# Patient Record
Sex: Male | Born: 1985
Health system: Southern US, Community
[De-identification: ages and names within clinical notes are randomized; demographics above are authoritative.]

## PROBLEM LIST (undated history)

## (undated) DIAGNOSIS — I10 Essential (primary) hypertension: Secondary | ICD-10-CM

## (undated) HISTORY — DX: Essential (primary) hypertension: I10

---

## 2016-01-14 ENCOUNTER — Telehealth: Payer: Self-pay | Admitting: Behavioral Health

## 2016-01-14 ENCOUNTER — Encounter: Payer: Self-pay | Admitting: Behavioral Health

## 2016-01-14 NOTE — Telephone Encounter (Signed)
Pre-Visit Call completed with patient and chart updated.   Pre-Visit Info documented in Specialty Comments under Snap[employer].    

## 2016-01-15 ENCOUNTER — Telehealth: Payer: Self-pay | Admitting: Medical

## 2016-01-15 ENCOUNTER — Encounter: Payer: Self-pay | Admitting: Medical

## 2016-01-15 ENCOUNTER — Ambulatory Visit (INDEPENDENT_AMBULATORY_CARE_PROVIDER_SITE_OTHER): Payer: 59 | Admitting: Medical

## 2016-01-15 VITALS — BP 128/88 | HR 77 | Temp 98.6°F | Ht 77.0 in | Wt 384.4 lb

## 2016-01-15 DIAGNOSIS — Z113 Encounter for screening for infections with a predominantly sexual mode of transmission: Secondary | ICD-10-CM

## 2016-01-15 DIAGNOSIS — I10 Essential (primary) hypertension: Secondary | ICD-10-CM | POA: Insufficient documentation

## 2016-01-15 DIAGNOSIS — R7989 Other specified abnormal findings of blood chemistry: Secondary | ICD-10-CM | POA: Diagnosis not present

## 2016-01-15 LAB — COMPREHENSIVE METABOLIC PANEL
ALT: 56 U/L — AB (ref 0–53)
AST: 27 U/L (ref 0–37)
Albumin: 4.3 g/dL (ref 3.5–5.2)
Alkaline Phosphatase: 67 U/L (ref 39–117)
BILIRUBIN TOTAL: 0.8 mg/dL (ref 0.2–1.2)
BUN: 16 mg/dL (ref 6–23)
CHLORIDE: 106 meq/L (ref 96–112)
CO2: 27 meq/L (ref 19–32)
CREATININE: 1.03 mg/dL (ref 0.40–1.50)
Calcium: 9.4 mg/dL (ref 8.4–10.5)
GFR: 91.21 mL/min (ref >60.00)
GLUCOSE: 80 mg/dL (ref 70–99)
Potassium: 4.1 mEq/L (ref 3.5–5.1)
SODIUM: 140 meq/L (ref 135–145)
Total Protein: 7.4 g/dL (ref 6.0–8.3)

## 2016-01-15 LAB — LIPID PANEL
CHOL/HDL RATIO: 5
Cholesterol: 143 mg/dL (ref 0–200)
HDL: 31.2 mg/dL — ABNORMAL LOW (ref >39.00)
LDL CALC: 91 mg/dL (ref 0–99)
NONHDL: 111.77
TRIGLYCERIDES: 103 mg/dL (ref 0.0–149.0)
VLDL: 20.6 mg/dL (ref 0.0–40.0)

## 2016-01-15 LAB — VITAMIN D 25 HYDROXY (VIT D DEFICIENCY, FRACTURES): VITD: 26.61 ng/mL — ABNORMAL LOW (ref 30.00–100.00)

## 2016-01-15 MED ORDER — OLMESARTAN MEDOXOMIL-HCTZ 40-12.5 MG PO TABS: 1.0000 | ORAL_TABLET | Freq: Every day | ORAL | Status: DC

## 2016-01-15 MED ORDER — VITAMIN D (ERGOCALCIFEROL) 1.25 MG (50000 UNIT) PO CAPS: 50000.0000 [IU] | ORAL_CAPSULE | ORAL | Status: DC

## 2016-01-15 NOTE — Assessment & Plan Note (Signed)
Refill benicar hct.

## 2016-01-15 NOTE — Patient Instructions (Addendum)
For you blood pressure would continue same benicar/hct.  Will recheck vitamin D level today as well.  Will check cmp and lipid panel today.  Recommend diet and exercise to maintain or loose weight.  Follow up in 3 months or as needed. Can do complete physical exam at that time if you want.

## 2016-01-15 NOTE — Assessment & Plan Note (Signed)
Diet and exercise counseled briefly.

## 2016-01-15 NOTE — Telephone Encounter (Signed)
Sent in ergocalciferol. While on rx high dose. Stop his daily low dose vitamin d.

## 2016-01-15 NOTE — Progress Notes (Signed)
Pre visit review using our clinic review tool, if applicable. No additional management support is needed unless otherwise documented below in the visit note. 

## 2016-01-15 NOTE — Progress Notes (Signed)
Subjective:    Patient ID: David Zuniga, male    DOB: 21-Sep-1986, 30 y.o.   MRN: [id number]  HPI   I have reviewed pt PMH, PSH, FH, Social History and Surgical History.  Pt works Architectural technologist. New to area. Went to school in [place], [place] does not exercise regularly, pt drinks one energy drink a day, Pt moderate healthy, single.  Htn-  Pt is on benicar 40 mg a day. Pt has been on med for 2.5 years. Pt last labs done in about one year.  Pt has history of low vitamin D. On  vit d. 1 yr since last checked.    Review of Systems  Constitutional: Negative for fever, chills and fatigue.  Respiratory: Negative for choking, shortness of breath and wheezing.   Cardiovascular: Negative for chest pain and palpitations.  Gastrointestinal: Negative for abdominal pain.  Musculoskeletal: Negative for back pain, joint swelling and neck stiffness.       Occasional shoulder and knee pain. Had pain since teenager. Mild pain comes and goes. Sometimes month with no pain. Very rare and can't specify which area hurts worse.  In past one of shoulder got mri. Maybe mild rotator cuff tear determined no surgery needed  Skin: Negative for rash.  Neurological: Negative for facial asymmetry, weakness and light-headedness.  Hematological: Negative for adenopathy. Does not bruise/bleed easily.  Psychiatric/Behavioral: Negative for behavioral problems and confusion.    Past Medical History  Diagnosis Date  . Hypertension     Social History   Social History  . Marital Status: Single    Spouse Name: N/A  . Number of Children: N/A  . Years of Education: N/A   Occupational History  . Not on file.   Social History Main Topics  . Smoking status: Never Smoker   . Smokeless tobacco: Not on file  . Alcohol Use: Not on file  . Drug Use: Not on file  . Sexual Activity: Not on file   Other Topics Concern  . Not on file   Social History Narrative    History reviewed. No pertinent past surgical  history.  Family History  Problem Relation Age of Onset  . Hypertension Mother   . Bipolar disorder Mother     manic depressive  . Hypertension Father   . Cancer Maternal Grandfather 30    lung cancer  . Alcohol abuse Paternal Grandfather   . Heart disease Paternal Grandfather   . Osteoporosis Maternal Grandmother     No Known Allergies  Current Outpatient Prescriptions on File Prior to Visit  Medication Sig Dispense Refill  . cholecalciferol (VITAMIN D) 1000 units tablet Take 1,000 Units by mouth daily.     No current facility-administered medications on file prior to visit.    BP 129/91 mmHg  Pulse 77  Temp(Src) 98.6 F (37 C) (Oral)  Ht 6\' 5"  (1.956 m)  Wt 384 lb 6.4 oz (174.363 kg)  BMI 45.57 kg/m2  SpO2 96%      Objective:   Physical Exam  General Mental Status- Alert. General Appearance- Not in acute distress.   Skin General: Color- Normal Color. Moisture- Normal Moisture.  Neck Carotid Arteries- Normal color. Moisture- Normal Moisture. No carotid bruits. No JVD.  Chest and Lung Exam Auscultation: Breath Sounds:-Normal.  Cardiovascular Auscultation:Rythm- Regular. Murmurs & Other Heart Sounds:Auscultation of the heart reveals- No Murmurs.  Abdomen Inspection:-Inspection Normal. Palpation/Percussion:Note:No mass. Palpation and Percussion of the abdomen reveal- Non Tender, Non Distended + BS, no rebound or guarding.  Neurologic Cranial Nerve exam:- CN III-XII intact(No nystagmus), symmetric smile. Strength:- 5/5 equal and symmetric strength both upper and lower extremities.      Assessment & Plan:  For you blood pressure would continue same benicar/hct.  Will recheck vitamin D level today as well.  Will check cmp and lipid panel today.  Recommend diet and exercise to maintain or loose weight.  Follow up in 3 months or as needed. Can do complete physical exam at that time if you want.

## 2016-01-16 LAB — HIV ANTIBODY (ROUTINE TESTING W REFLEX): HIV 1&2 Ab, 4th Generation: NONREACTIVE

## 2016-01-18 NOTE — Telephone Encounter (Signed)
Left message for pt that Rx was at pharmacy.

## 2016-05-13 ENCOUNTER — Ambulatory Visit (INDEPENDENT_AMBULATORY_CARE_PROVIDER_SITE_OTHER): Payer: 59 | Admitting: Medical

## 2016-05-13 ENCOUNTER — Encounter: Payer: Self-pay | Admitting: Medical

## 2016-05-13 VITALS — BP 139/89 | HR 93 | Temp 98.7°F | Ht 77.0 in | Wt 395.4 lb

## 2016-05-13 DIAGNOSIS — I10 Essential (primary) hypertension: Secondary | ICD-10-CM | POA: Diagnosis not present

## 2016-05-13 DIAGNOSIS — R7989 Other specified abnormal findings of blood chemistry: Secondary | ICD-10-CM | POA: Diagnosis not present

## 2016-05-13 DIAGNOSIS — R748 Abnormal levels of other serum enzymes: Secondary | ICD-10-CM | POA: Diagnosis not present

## 2016-05-13 DIAGNOSIS — R002 Palpitations: Secondary | ICD-10-CM | POA: Diagnosis not present

## 2016-05-13 LAB — VITAMIN D 25 HYDROXY (VIT D DEFICIENCY, FRACTURES): VITD: 27.54 ng/mL — ABNORMAL LOW (ref 30.00–100.00)

## 2016-05-13 MED ORDER — OLMESARTAN MEDOXOMIL-HCTZ 40-12.5 MG PO TABS: 1.0000 | ORAL_TABLET | Freq: Every day | ORAL | Status: DC

## 2016-05-13 NOTE — Progress Notes (Addendum)
   Subjective:    Patient ID: David Zuniga, male    DOB: February 25, 1986, 30 y.o.   MRN: [id number]  HPI  Pt in for follow up on htn. Pt has not been checking his bp on regular basis. [place] 123456. Could not remember diastolic. He thinks was in 80's.   Pt is trying to exercise twice a week. He walks mostly. He has gained some weight. Pt describes trying to eat healthy. He eats out couple of times a week.  Pt had low vitamn D on last visit. Brief rx followed by otc vit d.  Pt good cholesterol was little low last time.  Pt also states sometimes he will go to asleep at night and will feel brief mild transient sensation of palpitation or "pvc". Pt had this in the past and he states 2 years ago he had a holter done. But he moved before he got the results. He is not sure of cardiologist office. Pt drinks 1-2 cups coffee a day.  Review of Systems  Constitutional: Negative for fever, chills and fatigue.  Respiratory: Negative for cough, chest tightness, shortness of breath and wheezing.   Cardiovascular: Negative for chest pain and palpitations.  Gastrointestinal: Negative for abdominal pain.  Musculoskeletal: Negative for back pain.  Skin: Negative for rash.  Neurological: Negative for dizziness and headaches.  Hematological: Negative for adenopathy. Does not bruise/bleed easily.  Psychiatric/Behavioral: Negative for behavioral problems and confusion.       Objective:   Physical Exam   General Mental Status- Alert. General Appearance- Not in acute distress.   Chest and Lung Exam Auscultation: Breath Sounds:-Normal.  Cardiovascular Auscultation:Rythm- Regular. Murmurs & Other Heart Sounds:Auscultation of the heart reveals- No Murmurs.  Abdomen Inspection:-Inspeection Normal. Palpation/Percussion:Note:No mass. Palpation and Percussion of the abdomen reveal- Non Tender, Non Distended + BS, no rebound or guarding.   Neurologic Cranial Nerve exam:- CN  III-XII intact(No nystagmus), symmetric smile.      Assessment & Plan:   ekg showed nsr toay   Your bp is borderline today but less than 140/90. May be related to being here. Continue you benicar.hctz. Check your bp 2-3 times a week. See if staying consistently less than 140/90.  For faint palpitation reduce caffeine intake. We did ekg today. Please sign release form so we can get holter monitor you had done 2 years ago.  Will repeat your vitamin d level today.  For low hdl increase exercise. Will repeat lipid panel/hdl if you get in consistent exercise pattern.  For your weight I would recommend trying to eat 2000-2200 calories. Since you report much higher intake you may see rapid weight loss with this reduction.  Follow up in 4 months or as needed  Anavictoria Wilk, Percell Miller, [employer]

## 2016-05-13 NOTE — Patient Instructions (Addendum)
Your bp is borderline today but less than 140/90. May be related to being here. Continue you benicar.hctz. Check your bp 2-3 times a week. See if staying consistently less than 140/90.  For faint palpitation reduce caffeine intake. We did ekg today. Please sign release form so we can get holter monitor you had done 2 years ago.  Will repeat your vitamin d level today.  For low hdl increase exercise. Will repeat lipid panel/hdl if you get in consistent exercise pattern.  For your weight I would recommend trying to eat 2000-2200 calories. Since you report much higher intake you may see rapid weight loss with this reduction.  Follow up in 4 months  or as needed

## 2016-05-14 ENCOUNTER — Telehealth: Payer: Self-pay | Admitting: Medical

## 2016-05-14 MED ORDER — VITAMIN D (ERGOCALCIFEROL) 1.25 MG (50000 UNIT) PO CAPS: 50000.0000 [IU] | ORAL_CAPSULE | ORAL | Status: DC

## 2016-05-14 NOTE — Telephone Encounter (Signed)
Refill ergocalciferol

## 2016-05-17 NOTE — Progress Notes (Signed)
Quick Note:  Pt has seen results on MyChart and message also sent for patient to call back if any questions. ______ 

## 2016-06-17 ENCOUNTER — Telehealth: Payer: Self-pay | Admitting: Medical

## 2016-06-17 NOTE — Telephone Encounter (Signed)
Faxed medical release form to [facility]

## 2016-06-26 ENCOUNTER — Other Ambulatory Visit: Payer: Self-pay | Admitting: Medical

## 2016-09-09 ENCOUNTER — Ambulatory Visit (INDEPENDENT_AMBULATORY_CARE_PROVIDER_SITE_OTHER): Payer: 59 | Admitting: Medical

## 2016-09-09 ENCOUNTER — Encounter: Payer: Self-pay | Admitting: Medical

## 2016-09-09 VITALS — BP 139/89 | HR 98 | Temp 98.7°F | Ht 77.0 in | Wt 393.2 lb

## 2016-09-09 DIAGNOSIS — K219 Gastro-esophageal reflux disease without esophagitis: Secondary | ICD-10-CM

## 2016-09-09 DIAGNOSIS — I10 Essential (primary) hypertension: Secondary | ICD-10-CM

## 2016-09-09 DIAGNOSIS — R112 Nausea with vomiting, unspecified: Secondary | ICD-10-CM | POA: Diagnosis not present

## 2016-09-09 DIAGNOSIS — R1110 Vomiting, unspecified: Secondary | ICD-10-CM | POA: Diagnosis not present

## 2016-09-09 DIAGNOSIS — R0982 Postnasal drip: Secondary | ICD-10-CM

## 2016-09-09 LAB — COMPREHENSIVE METABOLIC PANEL
ALT: 59 U/L — AB (ref 0–53)
AST: 26 U/L (ref 0–37)
Albumin: 4.3 g/dL (ref 3.5–5.2)
Alkaline Phosphatase: 71 U/L (ref 39–117)
BUN: 13 mg/dL (ref 6–23)
CO2: 28 meq/L (ref 19–32)
Calcium: 9.4 mg/dL (ref 8.4–10.5)
Chloride: 105 mEq/L (ref 96–112)
Creatinine, Ser: 1 mg/dL (ref 0.40–1.50)
GFR: 93.94 mL/min (ref >60.00)
GLUCOSE: 103 mg/dL — AB (ref 70–99)
POTASSIUM: 4 meq/L (ref 3.5–5.1)
Sodium: 140 mEq/L (ref 135–145)
Total Bilirubin: 0.7 mg/dL (ref 0.2–1.2)
Total Protein: 7.3 g/dL (ref 6.0–8.3)

## 2016-09-09 LAB — CBC WITH DIFFERENTIAL/PLATELET
BASOS ABS: 0 10*3/uL (ref 0.0–0.1)
Basophils Relative: 0.2 % (ref 0.0–3.0)
Eosinophils Absolute: 0.1 10*3/uL (ref 0.0–0.7)
Eosinophils Relative: 1.2 % (ref 0.0–5.0)
HCT: 42.1 % (ref 39.0–52.0)
Hemoglobin: 14.6 g/dL (ref 13.0–17.0)
LYMPHS ABS: 3 10*3/uL (ref 0.7–4.0)
Lymphocytes Relative: 29.1 % (ref 12.0–46.0)
MCHC: 34.6 g/dL (ref 30.0–36.0)
MCV: 87.5 fl (ref 78.0–100.0)
MONOS PCT: 8 % (ref 3.0–12.0)
Monocytes Absolute: 0.8 10*3/uL (ref 0.1–1.0)
NEUTROS ABS: 6.3 10*3/uL (ref 1.4–7.7)
NEUTROS PCT: 61.5 % (ref 43.0–77.0)
PLATELETS: 350 10*3/uL (ref 150.0–400.0)
RBC: 4.82 Mil/uL (ref 4.22–5.81)
RDW: 12.9 % (ref 11.5–15.5)
WBC: 10.3 10*3/uL (ref 4.0–10.5)

## 2016-09-09 MED ORDER — RANITIDINE HCL 150 MG PO CAPS: 150.0000 mg | ORAL_CAPSULE | Freq: Two times a day (BID) | ORAL | 2 refills | Status: DC

## 2016-09-09 MED ORDER — LORATADINE 10 MG PO TABS: 10.0000 mg | ORAL_TABLET | Freq: Every day | ORAL | 2 refills | Status: DC

## 2016-09-09 NOTE — Progress Notes (Signed)
Pre visit review using our clinic tool,if applicable. No additional management support is needed unless otherwise documented below in the visit note.  

## 2016-09-09 NOTE — Progress Notes (Signed)
Subjective:    Patient ID: David Zuniga, male    DOB: 1986/08/29, 29 y.o.   MRN: [id number]  HPI   Pt in for checkup on his blood pressure. Pt check his bp at [facility] and his bp about the same.   Pt states he has possible pnd for about 6 weeks. When he wakes up in morning thinks pnd. During the day gets rare cough. Almost feels like something stuck in his throat. No sneezing or itching eyes. Pt has some heart burn occasionally after breakfast. No sinus pressure. In morning with feeling of pnd will actually have nausea and then dry heave. Pt has tried eating some cracker in morning and tried netty pot. Also tried eating crackers in the morning.  No stomach pain and no diarrhea.    Review of Systems  Constitutional: Negative for chills, fatigue and fever.  HENT: Positive for postnasal drip. Negative for congestion, sinus pressure, sneezing, sore throat and trouble swallowing.   Respiratory: Positive for cough. Negative for chest tightness, shortness of breath and wheezing.   Cardiovascular: Negative for chest pain and palpitations.  Gastrointestinal: Positive for nausea and vomiting. Negative for abdominal pain.  Endocrine: Negative for polydipsia, polyphagia and polyuria.  Genitourinary: Negative for dysuria.  Musculoskeletal: Negative for arthralgias and back pain.  Skin: Negative for rash.  Neurological: Negative for dizziness, syncope, speech difficulty, weakness, light-headedness, numbness and headaches.  Psychiatric/Behavioral: Negative for behavioral problems, confusion and suicidal ideas. The patient is not nervous/anxious.     Past Medical History:  Diagnosis Date  . Hypertension      Social History   Social History  . Marital status: Single    Spouse name: N/A  . Number of children: N/A  . Years of education: N/A   Occupational History  . Not on file.   Social History Main Topics  . Smoking status: Never Smoker  . Smokeless tobacco: Not on file  .  Alcohol use 0.0 oz/week     Comment: social. 1-2 drinks a month at most  . Drug use: No  . Sexual activity: Not on file   Other Topics Concern  . Not on file   Social History Narrative  . No narrative on file    No past surgical history on file.  Family History  Problem Relation Age of Onset  . Hypertension Mother   . Bipolar disorder Mother     manic depressive  . Hypertension Father   . Cancer Maternal Grandfather 68    lung cancer  . Alcohol abuse Paternal Grandfather   . Heart disease Paternal Grandfather   . Osteoporosis Maternal Grandmother     No Known Allergies  Current Outpatient Prescriptions on File Prior to Visit  Medication Sig Dispense Refill  . cholecalciferol (VITAMIN D) 1000 units tablet Take 1,000 Units by mouth daily.    . Multiple Vitamins-Minerals (MULTIVITAL) CHEW Chew 1 each by mouth.    . olmesartan-hydrochlorothiazide (BENICAR HCT) 40-12.5 MG tablet Take 1 tablet by mouth daily. 30 tablet 4  . olmesartan-hydrochlorothiazide (BENICAR HCT) 40-12.5 MG tablet TAKE 1 TABLET BY MOUTH DAILY 90 tablet 0  . Vitamin D, Ergocalciferol, (DRISDOL) 50000 units CAPS capsule Take 1 capsule (50,000 Units total) by mouth every 7 (seven) days. (Patient not taking: Reported on 09/09/2016) 6 capsule 0   No current facility-administered medications on file prior to visit.     BP (!) 144/77   Pulse 98   Temp 98.7 F (37.1 C) (Oral)   Ht  6\' 5"  (1.956 m)   Wt (!) 393 lb 3.2 oz (178.4 kg)   SpO2 97%   BMI 46.63 kg/m       Objective:   Physical Exam  General Mental Status- Alert. General Appearance- Not in acute distress.    Skin Rashes- No Rashes.  HEENT Head- Normal. Ear Auditory Canal - Left- Normal. Right - Normal.Tympanic Membrane- Left- Normal. Right- Normal. Eye Sclera/Conjunctiva- Left- Normal. Right- Normal. Nose & Sinuses Nasal Mucosa- Left-  Boggy and Congested. Right-  Boggy and  Congested. Bilateral no maxillary and  No frontal sinus  pressure.  Mouth & Throat Lips: Upper Lip- Normal: no dryness, cracking, pallor, cyanosis, or vesicular eruption. Lower Lip-Normal: no dryness, cracking, pallor, cyanosis or vesicular eruption. Buccal Mucosa- Bilateral- No Aphthous ulcers. Oropharynx- No Discharge or Erythema. +pnd. Tonsils: Characteristics- Bilateral- No Erythema or Congestion. Size/Enlargement- Bilateral- No enlargement. Discharge- bilateral-None.  Neck Neck- Supple. No Masses.  Chest and Lung Exam Auscultation: Breath Sounds:-Normal.  Cardiovascular Auscultation:Rythm- Regular. Murmurs & Other Heart Sounds:Auscultation of the heart reveals- No Murmurs.  Abdomen Inspection:-Inspeection Normal. Palpation/Percussion:Note:No mass. Palpation and Percussion of the abdomen reveal- Non Tender, Non Distended + BS, no rebound or guarding.   Neurologic Cranial Nerve exam:- CN III-XII intact(No nystagmus), symmetric smile. Strength:- 5/5 equal and symmetric strength both upper and lower extremities.      Assessment & Plan:  For your htn, continue your current bp medicine. Would recommend getting otc bp cuff to check and make sure bp consistently less than 140/90.  You may have some mild early allergy symptoms. Will rx claritin and assess response.   Also considering possible reflux as cause of recent symptoms. Use ranitidine. Rx given but start in 4-5 days.  Nausea may be associated with reflux but will get cbc and cmp today.  Follow up 7 days or as needed  Admiral Marcucci, Percell Miller, [employer]

## 2016-09-09 NOTE — Patient Instructions (Addendum)
For your htn, continue your current bp medicine. Would recommend getting otc bp cuff to check and make sure bp consistently less than 140/90.  You may have some mild early allergy symptoms. Will rx claritin and assess response.   Also considering possible reflux as cause of recent symptoms. Use ranitidine. Rx given but start in 4-5 days.  Nausea may be associated with reflux but will get cbc and cmp today.  Follow up 7 days or as needed   Food Choices for Gastroesophageal Reflux Disease, Adult When you have gastroesophageal reflux disease (GERD), the foods you eat and your eating habits are very important. Choosing the right foods can help ease the discomfort of GERD. WHAT GENERAL GUIDELINES DO I NEED TO FOLLOW?  Choose fruits, vegetables, whole grains, low-fat dairy products, and low-fat meat, fish, and poultry.  Limit fats such as oils, salad dressings, butter, nuts, and avocado.  Keep a food diary to identify foods that cause symptoms.  Avoid foods that cause reflux. These may be different for different people.  Eat frequent small meals instead of three large meals each day.  Eat your meals slowly, in a relaxed setting.  Limit fried foods.  Cook foods using methods other than frying.  Avoid drinking alcohol.  Avoid drinking large amounts of liquids with your meals.  Avoid bending over or lying down until 2-3 hours after eating. WHAT FOODS ARE NOT RECOMMENDED? The following are some foods and drinks that may worsen your symptoms: Vegetables Tomatoes. Tomato juice. Tomato and spaghetti sauce. Chili peppers. Onion and garlic. Horseradish. Fruits Oranges, grapefruit, and lemon (fruit and juice). Meats High-fat meats, fish, and poultry. This includes hot dogs, ribs, ham, sausage, salami, and bacon. Dairy Whole milk and chocolate milk. Sour cream. Cream. Butter. Ice cream. Cream cheese.  Beverages Coffee and tea, with or without caffeine. Carbonated beverages or energy  drinks. Condiments Hot sauce. Barbecue sauce.  Sweets/Desserts Chocolate and cocoa. Donuts. Peppermint and spearmint. Fats and Oils High-fat foods, including [place] fries and potato chips. Other Vinegar. Strong spices, such as black pepper, white pepper, red pepper, cayenne, curry powder, cloves, ginger, and chili powder. The items listed above may not be a complete list of foods and beverages to avoid. Contact your dietitian for more information.   This information is not intended to replace advice given to you by your health care provider. Make sure you discuss any questions you have with your health care provider.   Document Released: 11/14/2005 Document Revised: 12/05/2014 Document Reviewed: 09/18/2013 Elsevier Interactive Patient Education [employer].

## 2016-09-12 LAB — H. PYLORI BREATH TEST: H. PYLORI BREATH TEST: NOT DETECTED

## 2016-10-03 ENCOUNTER — Other Ambulatory Visit: Payer: Self-pay | Admitting: Medical

## 2016-12-09 ENCOUNTER — Encounter: Payer: Self-pay | Admitting: Medical

## 2016-12-09 ENCOUNTER — Ambulatory Visit (INDEPENDENT_AMBULATORY_CARE_PROVIDER_SITE_OTHER): Payer: 59 | Admitting: Medical

## 2016-12-09 VITALS — BP 149/92 | HR 93 | Temp 98.3°F | Resp 18 | Ht 77.0 in | Wt >= 6400 oz

## 2016-12-09 DIAGNOSIS — I10 Essential (primary) hypertension: Secondary | ICD-10-CM

## 2016-12-09 MED ORDER — HYDROCHLOROTHIAZIDE 12.5 MG PO CAPS: 12.5000 mg | ORAL_CAPSULE | Freq: Every day | ORAL | 0 refills | Status: DC

## 2016-12-09 NOTE — Patient Instructions (Addendum)
Your bp is moderate elevated by your checks and borderline today with our check. Continue current bp med but adding 12.5 mg hctz. Eat potassium rich foods. In one month if see adequate bp drop then will refill benicar at 40-25 mg dose.  If you get future allergy type flare can try xyzal. For reflux more daily type prilosec otc.  Try to get back in regular exercise return. Low salt/heart healthy diet.  Follow up in 3-6 months. (but labs can be done in 3 months. If you can investigate if we can use our lab or outside lab)

## 2016-12-09 NOTE — Progress Notes (Signed)
Pre visit review using our clinic review tool, if applicable. No additional management support is needed unless otherwise documented below in the visit note/SLS  

## 2016-12-09 NOTE — Progress Notes (Signed)
Subjective:    Patient ID: David Zuniga, male    DOB: 11/25/1986, 31 y.o.   MRN: [id number]  HPI   Pt in for follow up.  Pt her for bp follow up. He states his systolic has been 123456 when he checks. His diastolic varies more 70 to rare low 100. No chest pain or neurologic signs or symptoms. Pt not exercising.   Pt states his prior pnd has decreased. Claritin did not help. But states pnd has tapered off.  Pt gets occasional rare heart burn. Zantac did not help much. But symptoms rare. He is aware can get prilosec.   Review of Systems  Constitutional: Negative for chills, fatigue and fever.  HENT: Positive for postnasal drip. Negative for congestion, ear pain, nosebleeds, sinus pain and sinus pressure.        Very minimal pnd at this point.  Respiratory: Negative for cough, chest tightness, shortness of breath and wheezing.   Cardiovascular: Negative for chest pain and palpitations.  Gastrointestinal: Negative for abdominal pain, blood in stool, constipation, nausea and vomiting.       Rare reflux.  Skin: Negative for rash.  Neurological: Negative for dizziness and headaches.  Hematological: Negative for adenopathy. Does not bruise/bleed easily.  Psychiatric/Behavioral: Negative for agitation and confusion.    Past Medical History:  Diagnosis Date  . Hypertension      Social History   Social History  . Marital status: Single    Spouse name: N/A  . Number of children: N/A  . Years of education: N/A   Occupational History  . Not on file.   Social History Main Topics  . Smoking status: Never Smoker  . Smokeless tobacco: Not on file  . Alcohol use 0.0 oz/week     Comment: social. 1-2 drinks a month at most  . Drug use: No  . Sexual activity: Not on file   Other Topics Concern  . Not on file   Social History Narrative  . No narrative on file    No past surgical history on file.  Family History  Problem Relation Age of Onset  . Hypertension Mother   .  Bipolar disorder Mother     manic depressive  . Hypertension Father   . Cancer Maternal Grandfather 54    lung cancer  . Alcohol abuse Paternal Grandfather   . Heart disease Paternal Grandfather   . Osteoporosis Maternal Grandmother     No Known Allergies  Current Outpatient Prescriptions on File Prior to Visit  Medication Sig Dispense Refill  . cholecalciferol (VITAMIN D) 1000 units tablet Take 1,000 Units by mouth daily.    . Multiple Vitamins-Minerals (MULTIVITAL) CHEW Chew 1 each by mouth.    . olmesartan-hydrochlorothiazide (BENICAR HCT) 40-12.5 MG tablet Take 1 tablet by mouth daily. 30 tablet 4   No current facility-administered medications on file prior to visit.     BP (!) 149/92 (BP Location: Left Arm, Patient Position: Sitting, Cuff Size: Large)   Pulse 93   Temp 98.3 F (36.8 C) (Oral)   Resp 18   Ht 6\' 5"  (1.956 m)   Wt (!) 406 lb 6 oz (184.3 kg)   SpO2 99%   BMI 48.19 kg/m       Objective:   Physical Exam  General Mental Status- Alert. General Appearance- Not in acute distress.   Skin General: Color- Normal Color. Moisture- Normal Moisture.  Neck Carotid Arteries- Normal color. Moisture- Normal Moisture. No carotid bruits. No JVD.  Chest  and Lung Exam Auscultation: Breath Sounds:-Normal.  Cardiovascular Auscultation:Rythm- Regular. Murmurs & Other Heart Sounds:Auscultation of the heart reveals- No Murmurs.  Abdomen Inspection:-Inspeection Normal. Palpation/Percussion:Note:No mass. Palpation and Percussion of the abdomen reveal- Non Tender, Non Distended + BS, no rebound or guarding.    Neurologic Cranial Nerve exam:- CN III-XII intact(No nystagmus), symmetric smile. Strength:- 5/5 equal and symmetric strength both upper and lower extremities.      Assessment & Plan:  Your bp is moderate elevated by your checks and borderline today with our check. Continue current bp med but adding 12.5 mg hctz. Eat potassium rich foods. In one month if  see adequate bp drop then will refill benicar at 40-25 mg dose.  If you get future allergy type flare can try xyzl. For reflux more daily type prilosec otc.  Try to get back in regular exercise return. Low salt/heart healthy diet.  Follow up in 3-6 months. (but labs can be done in 3 months. If you can investigate if we can use our lab or outside lab)  David Zuniga, Percell Miller, [employer]

## 2016-12-26 ENCOUNTER — Encounter: Payer: Self-pay | Admitting: Medical

## 2016-12-26 MED ORDER — HYDROCHLOROTHIAZIDE 12.5 MG PO CAPS: 12.5000 mg | ORAL_CAPSULE | Freq: Every day | ORAL | 5 refills | Status: DC

## 2016-12-27 ENCOUNTER — Telehealth: Payer: Self-pay | Admitting: Medical

## 2016-12-27 MED ORDER — OLMESARTAN MEDOXOMIL-HCTZ 40-25 MG PO TABS: 1.0000 | ORAL_TABLET | Freq: Every day | ORAL | 3 refills | Status: DC

## 2016-12-27 NOTE — Telephone Encounter (Signed)
Copy & Pasted from My Chart message RE this matter:  Charlie Pitter  to Mackie Pai, PA-C     3:06 PM  Thanks, just got the message the 40-25 was filled. My BP is still varying but generally it is down according to my machine. Will keep checking and keeping track.   -David Zuniga

## 2016-12-27 NOTE — Telephone Encounter (Signed)
bencicar 40-25 rx sent in to his pharmacy.

## 2017-04-14 ENCOUNTER — Other Ambulatory Visit: Payer: Self-pay | Admitting: Medical

## 2017-05-13 ENCOUNTER — Other Ambulatory Visit: Payer: Self-pay | Admitting: Medical

## 2017-06-09 ENCOUNTER — Ambulatory Visit (INDEPENDENT_AMBULATORY_CARE_PROVIDER_SITE_OTHER): Payer: 59 | Admitting: Medical

## 2017-06-09 ENCOUNTER — Encounter: Payer: Self-pay | Admitting: Medical

## 2017-06-09 VITALS — BP 137/80 | HR 94 | Temp 98.4°F | Ht 77.0 in | Wt >= 6400 oz

## 2017-06-09 DIAGNOSIS — R5383 Other fatigue: Secondary | ICD-10-CM

## 2017-06-09 DIAGNOSIS — Z0000 Encounter for general adult medical examination without abnormal findings: Secondary | ICD-10-CM | POA: Diagnosis not present

## 2017-06-09 DIAGNOSIS — R739 Hyperglycemia, unspecified: Secondary | ICD-10-CM

## 2017-06-09 LAB — LIPID PANEL
CHOLESTEROL: 127 mg/dL (ref 0–200)
HDL: 29.5 mg/dL — AB (ref >39.00)
LDL CALC: 68 mg/dL (ref 0–99)
NonHDL: 97.9
Total CHOL/HDL Ratio: 4
Triglycerides: 149 mg/dL (ref 0.0–149.0)
VLDL: 29.8 mg/dL (ref 0.0–40.0)

## 2017-06-09 LAB — COMPREHENSIVE METABOLIC PANEL
ALBUMIN: 4.1 g/dL (ref 3.5–5.2)
ALT: 59 U/L — ABNORMAL HIGH (ref 0–53)
AST: 27 U/L (ref 0–37)
Alkaline Phosphatase: 69 U/L (ref 39–117)
BUN: 14 mg/dL (ref 6–23)
CHLORIDE: 105 meq/L (ref 96–112)
CO2: 27 meq/L (ref 19–32)
CREATININE: 0.98 mg/dL (ref 0.40–1.50)
Calcium: 9.5 mg/dL (ref 8.4–10.5)
GFR: 95.65 mL/min (ref >60.00)
GLUCOSE: 113 mg/dL — AB (ref 70–99)
Potassium: 4 mEq/L (ref 3.5–5.1)
SODIUM: 140 meq/L (ref 135–145)
Total Bilirubin: 0.5 mg/dL (ref 0.2–1.2)
Total Protein: 7.1 g/dL (ref 6.0–8.3)

## 2017-06-09 LAB — CBC WITH DIFFERENTIAL/PLATELET
BASOS PCT: 0.2 % (ref 0.0–3.0)
Basophils Absolute: 0 10*3/uL (ref 0.0–0.1)
EOS ABS: 0.1 10*3/uL (ref 0.0–0.7)
Eosinophils Relative: 1.4 % (ref 0.0–5.0)
HCT: 41.3 % (ref 39.0–52.0)
HEMOGLOBIN: 14.1 g/dL (ref 13.0–17.0)
LYMPHS ABS: 2.7 10*3/uL (ref 0.7–4.0)
Lymphocytes Relative: 26.2 % (ref 12.0–46.0)
MCHC: 34.2 g/dL (ref 30.0–36.0)
MCV: 87.4 fl (ref 78.0–100.0)
MONO ABS: 0.8 10*3/uL (ref 0.1–1.0)
Monocytes Relative: 7.5 % (ref 3.0–12.0)
NEUTROS PCT: 64.7 % (ref 43.0–77.0)
Neutro Abs: 6.7 10*3/uL (ref 1.4–7.7)
PLATELETS: 372 10*3/uL (ref 150.0–400.0)
RBC: 4.73 Mil/uL (ref 4.22–5.81)
RDW: 13.1 % (ref 11.5–15.5)
WBC: 10.3 10*3/uL (ref 4.0–10.5)

## 2017-06-09 LAB — HEMOGLOBIN A1C: Hgb A1c MFr Bld: 6.3 % (ref 4.6–6.5)

## 2017-06-09 LAB — TSH: TSH: 3.05 u[IU]/mL (ref 0.35–4.50)
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
## 2017-06-09 NOTE — Patient Instructions (Addendum)
For you wellness exam today I have ordered cbc, cmp, tsh, lipid panel, and ua.  Vaccine up to date.  Recommend exercise and healthy diet.  We will let you know lab results as they come in.  Follow up date appointment will be determined after lab review.    Preventive Care 18-39 Years, Male Preventive care refers to lifestyle choices and visits with your health care provider that can promote health and wellness. What does preventive care include?  A yearly physical exam. This is also called an annual well check.  Dental exams once or twice a year.  Routine eye exams. Ask your health care provider how often you should have your eyes checked.  Personal lifestyle choices, including: ? Daily care of your teeth and gums. ? Regular physical activity. ? Eating a healthy diet. ? Avoiding tobacco and drug use. ? Limiting alcohol use. ? Practicing safe sex. What happens during an annual well check? The services and screenings done by your health care provider during your annual well check will depend on your age, overall health, lifestyle risk factors, and family history of disease. Counseling Your health care provider may ask you questions about your:  Alcohol use.  Tobacco use.  Drug use.  Emotional well-being.  Home and relationship well-being.  Sexual activity.  Eating habits.  Work and work Statistician.  Screening You may have the following tests or measurements:  Height, weight, and BMI.  Blood pressure.  Lipid and cholesterol levels. These may be checked every 5 years starting at age 58.  Diabetes screening. This is done by checking your blood sugar (glucose) after you have not eaten for a while (fasting).  Skin check.  Hepatitis C blood test.  Hepatitis B blood test.  Sexually transmitted disease (STD) testing.  Discuss your test results, treatment options, and if necessary, the need for more tests with your health care provider. Vaccines Your health  care provider may recommend certain vaccines, such as:  Influenza vaccine. This is recommended every year.  Tetanus, diphtheria, and acellular pertussis (Tdap, Td) vaccine. You may need a Td booster every 10 years.  Varicella vaccine. You may need this if you have not been vaccinated.  HPV vaccine. If you are 69 or younger, you may need three doses over 6 months.  Measles, mumps, and rubella (MMR) vaccine. You may need at least one dose of MMR.You may also need a second dose.  Pneumococcal 13-valent conjugate (PCV13) vaccine. You may need this if you have certain conditions and have not been vaccinated.  Pneumococcal polysaccharide (PPSV23) vaccine. You may need one or two doses if you smoke cigarettes or if you have certain conditions.  Meningococcal vaccine. One dose is recommended if you are age 23-21 years and a first-year college student living in a residence hall, or if you have one of several medical conditions. You may also need additional booster doses.  Hepatitis A vaccine. You may need this if you have certain conditions or if you travel or work in places where you may be exposed to hepatitis A.  Hepatitis B vaccine. You may need this if you have certain conditions or if you travel or work in places where you may be exposed to hepatitis B.  Haemophilus influenzae type b (Hib) vaccine. You may need this if you have certain risk factors.  Talk to your health care provider about which screenings and vaccines you need and how often you need them. This information is not intended to replace advice given  to you by your health care provider. Make sure you discuss any questions you have with your health care provider. Document Released: 01/10/2002 Document Revised: 08/03/2016 Document Reviewed: 09/15/2015 Elsevier Interactive Patient Education  2017 [employer].

## 2017-06-18 ENCOUNTER — Other Ambulatory Visit: Payer: Self-pay | Admitting: Medical

## 2017-07-14 ENCOUNTER — Other Ambulatory Visit: Payer: Self-pay | Admitting: Medical

## 2017-07-20 ENCOUNTER — Emergency Department (HOSPITAL_BASED_OUTPATIENT_CLINIC_OR_DEPARTMENT_OTHER)
Admission: EM | Admit: 2017-07-20 | Discharge: 2017-07-20 | Disposition: A | Discharge status: Home / Self Care | Payer: 59 | Attending: Emergency Medicine | Admitting: Emergency Medicine

## 2017-07-20 ENCOUNTER — Encounter (HOSPITAL_BASED_OUTPATIENT_CLINIC_OR_DEPARTMENT_OTHER): Payer: Self-pay | Admitting: *Deleted

## 2017-07-20 ENCOUNTER — Emergency Department (HOSPITAL_BASED_OUTPATIENT_CLINIC_OR_DEPARTMENT_OTHER): Payer: 59

## 2017-07-20 DIAGNOSIS — R091 Pleurisy: Secondary | ICD-10-CM | POA: Diagnosis not present

## 2017-07-20 DIAGNOSIS — R079 Chest pain, unspecified: Secondary | ICD-10-CM | POA: Diagnosis not present

## 2017-07-20 DIAGNOSIS — I10 Essential (primary) hypertension: Secondary | ICD-10-CM | POA: Insufficient documentation

## 2017-07-20 DIAGNOSIS — Z79899 Other long term (current) drug therapy: Secondary | ICD-10-CM | POA: Diagnosis not present

## 2017-07-20 LAB — CBC WITH DIFFERENTIAL/PLATELET
BASOS ABS: 0 10*3/uL (ref 0.0–0.1)
Basophils Relative: 0 %
EOS PCT: 1 %
Eosinophils Absolute: 0.1 10*3/uL (ref 0.0–0.7)
HEMATOCRIT: 38.5 % — AB (ref 39.0–52.0)
Hemoglobin: 13.4 g/dL (ref 13.0–17.0)
LYMPHS ABS: 3.4 10*3/uL (ref 0.7–4.0)
LYMPHS PCT: 33 %
MCH: 30 pg (ref 26.0–34.0)
MCHC: 34.8 g/dL (ref 30.0–36.0)
MCV: 86.1 fL (ref 78.0–100.0)
MONO ABS: 0.9 10*3/uL (ref 0.1–1.0)
Monocytes Relative: 9 %
NEUTROS ABS: 6.1 10*3/uL (ref 1.7–7.7)
Neutrophils Relative %: 57 %
Platelets: 341 10*3/uL (ref 150–400)
RBC: 4.47 MIL/uL (ref 4.22–5.81)
RDW: 12.9 % (ref 11.5–15.5)
WBC: 10.6 10*3/uL — ABNORMAL HIGH (ref 4.0–10.5)

## 2017-07-20 LAB — BASIC METABOLIC PANEL
ANION GAP: 8 (ref 5–15)
BUN: 14 mg/dL (ref 6–20)
CO2: 27 mmol/L (ref 22–32)
Calcium: 9.2 mg/dL (ref 8.9–10.3)
Chloride: 102 mmol/L (ref 101–111)
Creatinine, Ser: 1.12 mg/dL (ref 0.61–1.24)
GFR calc Af Amer: >60 mL/min (ref >60)
GFR calc non Af Amer: >60 mL/min (ref >60)
GLUCOSE: 98 mg/dL (ref 65–99)
POTASSIUM: 3.2 mmol/L — AB (ref 3.5–5.1)
Sodium: 137 mmol/L (ref 135–145)

## 2017-07-20 LAB — TROPONIN I: Troponin I: <0.03 ng/mL (ref <0.03)

## 2017-07-20 MED ORDER — KETOROLAC TROMETHAMINE 30 MG/ML IJ SOLN
30.0000 mg | Freq: Once | INTRAMUSCULAR | Status: AC
  Administered 2017-07-20: 30 mg via INTRAVENOUS
  Filled 2017-07-20: qty 1

## 2017-07-20 MED ORDER — NAPROXEN 500 MG PO TABS: 500.0000 mg | ORAL_TABLET | Freq: Two times a day (BID) | ORAL | 0 refills | Status: DC

## 2017-07-20 NOTE — ED Provider Notes (Signed)
[facility] Provider Note   CSN: [id number] Arrival date & time: 07/20/17  1703     History   Chief Complaint Chief Complaint  Patient presents with  . Chest Pain    HPI David Zuniga is a 31 y.o. male. Chief complaint is chest pain.  HPI 31 year old male. History of hypertension. Presents with 3-4 days of chest, neck, back, and left shoulder pain. It is sharp. It is somewhat pleuritic. He does not feel short of breath. No past similar episodes. No fall or injury. He's had some diarrhea and some mild nausea and felt clammy 2 days ago as well. Diarrhea is improving.  No history of PE. No risk factors. No smoking. No hormone use. No prolonged immobilization, splints, fractures, or procedures.  No family history heart disease, or PE.  Past Medical History:  Diagnosis Date  . Hypertension     Patient Active Problem List   Diagnosis Date Noted  . HTN (hypertension) 01/15/2016  . Severe obesity (BMI >= 40) ([place]) 01/15/2016    History reviewed. No pertinent surgical history.     Home Medications    Prior to Admission medications   Medication Sig Start Date End Date Taking? Authorizing Provider  cholecalciferol (VITAMIN D) 1000 units tablet Take 1,000 Units by mouth daily.    [provider]  levocetirizine (XYZAL) 5 MG tablet Take 5 mg by mouth every evening.    [provider]  Multiple Vitamins-Minerals (MULTIVITAL) CHEW Chew 1 each by mouth.    [provider]  naproxen (NAPROSYN) 500 MG tablet Take 1 tablet (500 mg total) by mouth 2 (two) times daily. 07/20/17   Tanna Furry, MD  olmesartan-hydrochlorothiazide [facility] HCT) 40-25 MG tablet TAKE 1 TABLET BY MOUTH DAILY 07/20/17   Saguier, Percell Miller, PA-C  omeprazole (PRILOSEC) 10 MG capsule Take 1 capsule by mouth daily. 02/26/17   [provider]    Family History Family History  Problem Relation Age of Onset  . Hypertension Mother   . Bipolar disorder Mother        manic  depressive  . Hypertension Father   . Cancer Maternal Grandfather 38       lung cancer  . Alcohol abuse Paternal Grandfather   . Heart disease Paternal Grandfather   . Osteoporosis Maternal Grandmother     Social History Social History  Substance Use Topics  . Smoking status: Never Smoker  . Smokeless tobacco: Never Used  . Alcohol use 0.0 oz/week     Comment: social. 1-2 drinks a month at most     Allergies   Patient has no known allergies.   Review of Systems Review of Systems  Constitutional: Negative for appetite change, chills, diaphoresis, fatigue and fever.  HENT: Negative for mouth sores, sore throat and trouble swallowing.   Eyes: Negative for visual disturbance.  Respiratory: Negative for cough, chest tightness, shortness of breath and wheezing.   Cardiovascular: Positive for chest pain.  Gastrointestinal: Negative for abdominal distention, abdominal pain, diarrhea, nausea and vomiting.  Endocrine: Negative for polydipsia, polyphagia and polyuria.  Genitourinary: Negative for dysuria, frequency and hematuria.  Musculoskeletal: Positive for back pain. Negative for gait problem.  Skin: Negative for color change, pallor and rash.  Neurological: Negative for dizziness, syncope, light-headedness and headaches.  Hematological: Does not bruise/bleed easily.  Psychiatric/Behavioral: Negative for behavioral problems and confusion.     Physical Exam Updated Vital Signs BP 121/72 (BP Location: Right Arm)   Pulse 79   Temp 98.5 F (36.9  C) (Oral)   Resp 15   Ht 6\' 5"  (1.956 m)   Wt (!) 190.5 kg (420 lb)   SpO2 99%   BMI 49.80 kg/m   Physical Exam  Constitutional: He is oriented to person, place, and time. He appears well-developed and well-nourished. No distress.  Large stature obese male. No acute distress.  HENT:  Head: Normocephalic.  Eyes: Pupils are equal, round, and reactive to light. Conjunctivae are normal. No scleral icterus.  Neck: Normal range of  motion. Neck supple. No thyromegaly present.  Cardiovascular: Normal rate and regular rhythm.  Exam reveals no gallop and no friction rub.   No murmur heard. Pulmonary/Chest: Effort normal and breath sounds normal. No respiratory distress. He has no wheezes. He has no rales.  Clear bilateral breath sounds. Nontender over the chest. Full range of motion of the shoulder. No rash or lesions or vesicles across the chest or back.  Abdominal: Soft. Bowel sounds are normal. He exhibits no distension. There is no tenderness. There is no rebound.  Musculoskeletal: Normal range of motion.  Neurological: He is alert and oriented to person, place, and time.  Skin: Skin is warm and dry. No rash noted.  Psychiatric: He has a normal mood and affect. His behavior is normal.     ED Treatments / Results  Labs (all labs ordered are listed, but only abnormal results are displayed) Labs Reviewed  CBC WITH DIFFERENTIAL/PLATELET - Abnormal; Notable for the following:       Result Value   WBC 10.6 (*)    HCT 38.5 (*)    All other components within normal limits  BASIC METABOLIC PANEL - Abnormal; Notable for the following:    Potassium 3.2 (*)    All other components within normal limits  TROPONIN I    EKG  EKG Interpretation  Date/Time:  Thursday July 20 2017 17:14:47 EDT Ventricular Rate:  76 PR Interval:  194 QRS Duration: 110 QT Interval:  402 QTC Calculation: 452 R Axis:   -21 Text Interpretation:  Normal sinus rhythm Normal ECG Confirmed by Tanna Furry [phone]) on 07/20/2017 5:15:46 PM       Radiology Dg Chest 2 View  Result Date: 07/20/2017 CLINICAL DATA:  Chest pain, left-sided radiating to the back. EXAM: CHEST  2 VIEW COMPARISON:  None. FINDINGS: Monitoring leads overlie the patient. Normal cardiac and mediastinal contours. No consolidative pulmonary opacities. No pleural effusion or pneumothorax. IMPRESSION: No acute cardiopulmonary process. Electronically Signed   By: Lovey Newcomer  M.D.   On: 07/20/2017 18:47    Procedures Procedures (including critical care time)  Medications Ordered in ED Medications  ketorolac (TORADOL) 30 MG/ML injection 30 mg (30 mg Intravenous Given 07/20/17 1811)     Initial Impression / Assessment and Plan / ED Course  I have reviewed the triage vital signs and the nursing notes.  Pertinent labs & imaging results that were available during my care of the patient were reviewed by me and considered in my medical decision making (see chart for details).    EKG shows normal sinus rhythm. No tachycardia. No ischemic changes. Chest x-ray shows no pneumothorax, effusion, or infiltrate. Troponin normal. WBC 10.6. Discussion no risks for PE. He is Park negative negative Wells. Not tachypneic or tachycardic. Normal EKG and troponin. Normal chest x-ray. Symptoms with his abdominal complaints and diarrhea may be viral and may be simple pleurisy. Given Toradol his symptoms have improved. Plan naproxen. Home. Recheck any worsening symptoms.  Final Clinical Impressions(s) /  ED Diagnoses   Final diagnoses:  Chest pain, unspecified type  Pleurisy    New Prescriptions New Prescriptions   NAPROXEN (NAPROSYN) 500 MG TABLET    Take 1 tablet (500 mg total) by mouth 2 (two) times daily.     Tanna Furry, MD 07/20/17 1950

## 2017-07-20 NOTE — ED Triage Notes (Signed)
Chest pain x 3 days. Pain in his left shoulder. Radiation into his back.

## 2017-07-20 NOTE — ED Notes (Signed)
ED Provider at bedside. 

## 2017-08-09 ENCOUNTER — Emergency Department (HOSPITAL_BASED_OUTPATIENT_CLINIC_OR_DEPARTMENT_OTHER): Payer: 59

## 2017-08-09 ENCOUNTER — Encounter (HOSPITAL_BASED_OUTPATIENT_CLINIC_OR_DEPARTMENT_OTHER): Payer: Self-pay

## 2017-08-09 ENCOUNTER — Ambulatory Visit (INDEPENDENT_AMBULATORY_CARE_PROVIDER_SITE_OTHER): Payer: 59 | Admitting: Medical

## 2017-08-09 ENCOUNTER — Encounter: Payer: Self-pay | Admitting: Medical

## 2017-08-09 ENCOUNTER — Telehealth: Payer: Self-pay | Admitting: Medical

## 2017-08-09 ENCOUNTER — Emergency Department (HOSPITAL_BASED_OUTPATIENT_CLINIC_OR_DEPARTMENT_OTHER)
Admission: EM | Admit: 2017-08-09 | Discharge: 2017-08-09 | Disposition: A | Discharge status: Home / Self Care | Payer: 59 | Attending: Emergency Medicine | Admitting: Emergency Medicine

## 2017-08-09 VITALS — BP 140/78 | HR 79 | Temp 98.0°F | Resp 16 | Ht 77.0 in | Wt >= 6400 oz

## 2017-08-09 DIAGNOSIS — I10 Essential (primary) hypertension: Secondary | ICD-10-CM | POA: Diagnosis not present

## 2017-08-09 DIAGNOSIS — Z79899 Other long term (current) drug therapy: Secondary | ICD-10-CM | POA: Insufficient documentation

## 2017-08-09 DIAGNOSIS — R0789 Other chest pain: Secondary | ICD-10-CM

## 2017-08-09 DIAGNOSIS — R002 Palpitations: Secondary | ICD-10-CM | POA: Diagnosis not present

## 2017-08-09 DIAGNOSIS — R079 Chest pain, unspecified: Secondary | ICD-10-CM | POA: Diagnosis present

## 2017-08-09 MED ORDER — GI COCKTAIL ~~LOC~~
30.0000 mL | Freq: Once | ORAL | Status: AC
  Administered 2017-08-09: 30 mL via ORAL
  Filled 2017-08-09: qty 30

## 2017-08-09 NOTE — Patient Instructions (Signed)
For your recent left shoulder pain chest pressure and palpitations, I do think it is best to be evaluated in the emergency department as they might decide to do 2 sets of troponin. I don't think it is wise to do those as outpatient in our office.  Regarding your palpitations I will go ahead and refer you to cardiologist so likely Holter monitor can be done.  I have talked with emergency department physician downstairs and they are aware that you are coming down.  Follow-up date here to be determined after emergency department evaluation you.

## 2017-08-09 NOTE — Discharge Instructions (Signed)
Try to avoid foods that make this worse.  Return for worsening symptoms.  Follow up with your PCP.  You can try magnesium supplements for your PVC's.  Try OTC twice a day.

## 2017-08-09 NOTE — ED Provider Notes (Signed)
[facility] DEPT [facility] Provider Note   CSN: [id number] Arrival date & time: 08/09/17  1216     History   Chief Complaint Chief Complaint  Patient presents with  . Chest Pain    HPI David Zuniga is a 31 y.o. male.  31 yo M with a chief complaint of left-sided chest pain. He describes this as a discomfort. Sometimes this radiates up into the left shoulder. Comes and goes. Is nonexertional. He is able to exercise without symptoms. Denies shortness of breath. Has had one episode of diaphoresis in the past. Was seen about a month ago in the ED with similar symptoms. He went to his primary care physician today and had some symptoms in the office. Was sent down for laboratory testing. The patient states that the symptoms actually started last night after he ate pizza. He also has been having some increased frequency of PVCs while trying to go to sleep. He denies lower extremity edema denies recent immobilization and denies hemoptysis denies history of cancer. No family history of MI. Denies smoking hyperlipidemia or diabetes. Does have a history of hypertension and is on medications.   The history is provided by the patient.  Chest Pain   This is a new problem. The current episode started 2 days ago. The problem has not changed since onset.The pain is associated with eating. The pain is present in the substernal region. The pain is at a severity of 7/10. The pain is moderate. The quality of the pain is described as brief. The pain does not radiate. Duration of episode(s) is 30 minutes. Associated symptoms include diaphoresis. Pertinent negatives include no abdominal pain, no fever, no headaches, no palpitations, no shortness of breath and no vomiting. He has tried nothing for the symptoms. The treatment provided no relief.  His past medical history is significant for hypertension.  Pertinent negatives for past medical history include no DVT, no hyperlipidemia, no MI and no PE.  Pertinent  negatives for family medical history include: no early MI.    Past Medical History:  Diagnosis Date  . Hypertension     Patient Active Problem List   Diagnosis Date Noted  . HTN (hypertension) 01/15/2016  . Severe obesity (BMI >= 40) ([place]) 01/15/2016    History reviewed. No pertinent surgical history.     Home Medications    Prior to Admission medications   Medication Sig Start Date End Date Taking? Authorizing Provider  naproxen (NAPROSYN) 500 MG tablet Take 500 mg by mouth 2 (two) times daily with a meal.   Yes [provider]  cholecalciferol (VITAMIN D) 1000 units tablet Take 1,000 Units by mouth daily.    [provider]  levocetirizine (XYZAL) 5 MG tablet Take 5 mg by mouth every evening.    [provider]  Multiple Vitamins-Minerals (MULTIVITAL) CHEW Chew 1 each by mouth.    [provider]  olmesartan-hydrochlorothiazide (BENICAR HCT) 40-25 MG tablet TAKE 1 TABLET BY MOUTH DAILY 07/20/17   Saguier, Percell Miller, PA-C  omeprazole (PRILOSEC) 10 MG capsule Take 1 capsule by mouth daily. 02/26/17   [provider]    Family History Family History  Problem Relation Age of Onset  . Hypertension Mother   . Bipolar disorder Mother        manic depressive  . Hypertension Father   . Cancer Maternal Grandfather 33       lung cancer  . Alcohol abuse Paternal Grandfather   . Heart disease Paternal Grandfather   .  Osteoporosis Maternal Grandmother     Social History Social History  Substance Use Topics  . Smoking status: Never Smoker  . Smokeless tobacco: Never Used  . Alcohol use 0.0 oz/week     Comment: occ     Allergies   Patient has no known allergies.   Review of Systems Review of Systems  Constitutional: Positive for diaphoresis. Negative for chills and fever.  HENT: Negative for congestion and facial swelling.   Eyes: Negative for discharge and visual disturbance.  Respiratory: Negative for shortness of breath.     Cardiovascular: Positive for chest pain. Negative for palpitations.  Gastrointestinal: Negative for abdominal pain, diarrhea and vomiting.  Musculoskeletal: Negative for arthralgias and myalgias.  Skin: Negative for color change and rash.  Neurological: Negative for tremors, syncope and headaches.  Psychiatric/Behavioral: Negative for confusion and dysphoric mood.     Physical Exam Updated Vital Signs There were no vitals taken for this visit.  Physical Exam  Constitutional: He is oriented to person, place, and time. He appears well-developed and well-nourished.  HENT:  Head: Normocephalic and atraumatic.  Eyes: Pupils are equal, round, and reactive to light. EOM are normal.  Neck: Normal range of motion. Neck supple. No JVD present.  Cardiovascular: Normal rate and regular rhythm.  Exam reveals no gallop and no friction rub.   No murmur heard. Pulmonary/Chest: No respiratory distress. He has no wheezes.  Abdominal: He exhibits no distension and no mass. There is no tenderness. There is no rebound and no guarding.  Musculoskeletal: Normal range of motion.  Neurological: He is alert and oriented to person, place, and time.  Skin: No rash noted. No pallor.  Psychiatric: He has a normal mood and affect. His behavior is normal.  Nursing note and vitals reviewed.    ED Treatments / Results  Labs (all labs ordered are listed, but only abnormal results are displayed) Labs Reviewed - No data to display  EKG  EKG Interpretation  Date/Time:  Wednesday August 09 2017 12:24:07 EDT Ventricular Rate:  84 PR Interval:  194 QRS Duration: 98 QT Interval:  390 QTC Calculation: 460 R Axis:     Text Interpretation:  Normal sinus rhythm Normal ECG No significant change since last tracing Confirmed by Deno Etienne [phone]) on 08/09/2017 12:26:23 PM       Radiology Dg Chest 2 View  Result Date: 08/09/2017 CLINICAL DATA:  Left shoulder pain, chest pressure, and palpitations for past 3  weeks. History of hypertension. Nonsmoker. EXAM: CHEST  2 VIEW COMPARISON:  Chest x-ray of July 20, 2017 FINDINGS: The lungs are reasonably well inflated and clear. The heart and pulmonary vascularity are normal. The mediastinum is normal in width. There is no pleural effusion. The bony thorax exhibits no acute abnormality. IMPRESSION: There is no active cardiopulmonary disease. Electronically Signed   By: David  Martinique M.D.   On: 08/09/2017 12:55    Procedures Procedures (including critical care time)  Medications Ordered in ED Medications  gi cocktail (Maalox,Lidocaine,Donnatal) (not administered)     Initial Impression / Assessment and Plan / ED Course  I have reviewed the triage vital signs and the nursing notes.  Pertinent labs & imaging results that were available during my care of the patient were reviewed by me and considered in my medical decision making (see chart for details).     32 yo M With a chief complaint of chest pain. He was sent down from family medicine clinic for a delta troponin. When I discussed this  with him the patient said he could not possibly stay, and that he needs to get back to work. I discussed risks and benefits of staying in the ED. I discussed the limited utility of 1 trop as the symptoms just started about 45 min ago. The patient elects to return if symptoms worsen also will try and return to get a single troponin later in the evening if possible. EKG is unremarkable awaiting for chest x-ray.  CXR negative. D/c home.   1:02 PM:  I have discussed the diagnosis/risks/treatment options with the patient and family and believe the pt to be eligible for discharge home to follow-up with PCP. We also discussed returning to the ED immediately if new or worsening sx occur. We discussed the sx which are most concerning (e.g., sudden worsening pain, fever, inability to tolerate by mouth) that necessitate immediate return. Medications administered to the patient  during their visit and any new prescriptions provided to the patient are listed below.  Medications given during this visit Medications  gi cocktail (Maalox,Lidocaine,Donnatal) (not administered)     The patient appears reasonably screen and/or stabilized for discharge and I doubt any other medical condition or other [facility] requiring further screening, evaluation, or treatment in the ED at this time prior to discharge.    Final Clinical Impressions(s) / ED Diagnoses   Final diagnoses:  Nonspecific chest pain    New Prescriptions New Prescriptions   No medications on file     Deno Etienne, DO 08/09/17 1302

## 2017-08-09 NOTE — Telephone Encounter (Signed)
Accidentally opened patient's chart.

## 2017-08-09 NOTE — ED Notes (Signed)
ED Provider at bedside. 
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
## 2017-08-09 NOTE — ED Triage Notes (Signed)
C/o intermittent CP x 3 weeks-was sent today by PCP after f/u visit-NAD-steady gait
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
## 2017-08-13 ENCOUNTER — Other Ambulatory Visit: Payer: Self-pay | Admitting: Medical

## 2017-08-15 ENCOUNTER — Ambulatory Visit (INDEPENDENT_AMBULATORY_CARE_PROVIDER_SITE_OTHER): Payer: 59 | Admitting: Cardiology

## 2017-08-15 ENCOUNTER — Encounter: Payer: Self-pay | Admitting: Cardiology

## 2017-08-15 VITALS — BP 120/82 | HR 88 | Resp 12 | Ht 77.0 in | Wt >= 6400 oz

## 2017-08-15 DIAGNOSIS — I10 Essential (primary) hypertension: Secondary | ICD-10-CM | POA: Diagnosis not present

## 2017-08-15 DIAGNOSIS — R079 Chest pain, unspecified: Secondary | ICD-10-CM | POA: Diagnosis not present

## 2017-08-15 NOTE — Progress Notes (Signed)
Cardiology Office Note:    Date:  08/15/2017   ID:  David Zuniga, DOB 12-07-1985, MRN [id number]  PCP:  Mackie Pai, PA-C  Cardiologist:  Jenean Lindau, MD   Referring MD: Mackie Pai, PA-C    ASSESSMENT:    1. Essential hypertension   2. Severe obesity (BMI >= 40) (H[facility])   3. Chest pain, unspecified type    PLAN:    In order of problems listed above:  1. I discussed my findings with the patient at extensive length. I reassured him. His symptoms are not typical for coronary etiology. In view of his risk factors namely essential hypertension and orbital obesity he will undergo exercise stress echo to reassure him. 2. The patient has essential hypertension and his blood pressure is well controlled. 3. The patient is morbidly obese and risks of obesity explained and he verbalized understanding. Diet was discussed. He will be seen in follow-up appointment in a month or earlier if he has any concerns. He knows to go to the nearest emergency room for any significant problems.   Medication Adjustments/Labs and Tests Ordered: Current medicines are reviewed at length with the patient today.  Concerns regarding medicines are outlined above.  No orders of the defined types were placed in this encounter.  No orders of the defined types were placed in this encounter.    History of Present Illness:    David Zuniga is a 31 y.o. male who is being seen today for the evaluation of chest pain at the request of Saguier, Percell Miller, [place]. Patient is a pleasant 31 year old male. He is morbidly obese.  He works for the [employer]. He mentions to me that he has chest pain at times. This is not related to exertion. It is a knifelike sensation in the chest with no radiation. He does use a stationary bike on a fairly regular basis and with this he does not have any symptoms. He complains of occasional skipped beat and he says that this happens because of PVCs. At the time of  my evaluation is alert awake oriented and in no distress. He does denies any family history of coronary artery disease.  Past Medical History:  Diagnosis Date  . Hypertension     History reviewed. No pertinent surgical history.  Current Medications: Current Meds  Medication Sig  . cholecalciferol (VITAMIN D) 1000 units tablet Take 1,000 Units by mouth daily.  Marland Kitchen levocetirizine (XYZAL) 5 MG tablet Take 5 mg by mouth every evening.  . magnesium gluconate (MAGONATE) 500 MG tablet Take 500 mg by mouth 2 (two) times daily.  . Multiple Vitamins-Minerals (MULTIVITAL) CHEW Chew 1 each by mouth.  . olmesartan-hydrochlorothiazide (BENICAR HCT) 40-25 MG tablet TAKE 1 TABLET BY MOUTH DAILY  . omeprazole (PRILOSEC) 10 MG capsule Take 1 capsule by mouth daily.     Allergies:   Patient has no known allergies.   Social History   Social History  . Marital status: Single    Spouse name: N/A  . Number of children: N/A  . Years of education: N/A   Social History Main Topics  . Smoking status: Never Smoker  . Smokeless tobacco: Never Used  . Alcohol use 0.0 oz/week     Comment: occ  . Drug use: No  . Sexual activity: Not Asked   Other Topics Concern  . None   Social History Narrative  . None     Family History: The patient's family history includes Alcohol abuse in his paternal  grandfather; Bipolar disorder in his mother; Cancer (age of onset: 30) in his maternal grandfather; Heart disease in his paternal grandfather; Hypertension in his father and mother; Osteoporosis in his maternal grandmother.  ROS:   Please see the history of present illness.    All other systems reviewed and are negative.  EKGs/Labs/Other Studies Reviewed:    The following studies were reviewed today: I reviewed EKG and records from primary care providers. Lipids were also reviewed with him.   Recent Labs: 06/09/2017: ALT 59; TSH 3.05 07/20/2017: BUN 14; Creatinine, Ser 1.12; Hemoglobin 13.4; Platelets 341;  Potassium 3.2; Sodium 137  Recent Lipid Panel    Component Value Date/Time   CHOL 127 06/09/2017 0916   TRIG 149.0 06/09/2017 0916   HDL 29.50 (L) 06/09/2017 0916   CHOLHDL 4 06/09/2017 0916   VLDL 29.8 06/09/2017 0916   LDLCALC 68 06/09/2017 0916    Physical Exam:    VS:  BP 120/82   Pulse 88   Resp 12   Ht 6\' 5"  (1.956 m)   Wt (!) 407 lb 12.8 oz (185 kg)   BMI 48.36 kg/m     Wt Readings from Last 3 Encounters:  08/15/17 (!) 407 lb 12.8 oz (185 kg)  08/09/17 (!) 410 lb 12.8 oz (186.3 kg)  07/20/17 (!) 420 lb (190.5 kg)     GEN: Patient is in no acute distress HEENT: Normal NECK: No JVD; No carotid bruits LYMPHATICS: No lymphadenopathy CARDIAC: S1 S2 regular, 2/6 systolic murmur at the apex. RESPIRATORY:  Clear to auscultation without rales, wheezing or rhonchi  ABDOMEN: Soft, non-tender, non-distended MUSCULOSKELETAL:  No edema; No deformity  SKIN: Warm and dry NEUROLOGIC:  Alert and oriented x 3 PSYCHIATRIC:  Normal affect    Signed, Jenean Lindau, MD  08/15/2017 8:33 AM    [place]

## 2017-08-15 NOTE — Addendum Note (Signed)
Addended by: Kathyrn Sheriff on: 08/15/2017 09:00 AM   Modules accepted: Orders

## 2017-08-15 NOTE — Patient Instructions (Signed)
Medication Instructions:  Your physician recommends that you continue on your current medications as directed. Please refer to the Current Medication list given to you today.  Labwork: None   Testing/Procedures: Your physician has requested that you have a stress echocardiogram. For further information please visit [email or web address]. Please follow instruction sheet as given.  Please report to [address], [address] [place], [place] the day of your testing.   Your physician has recommended that you wear a holter monitor. Holter monitors are medical devices that record the heart's electrical activity. Doctors most often use these monitors to diagnose arrhythmias. Arrhythmias are problems with the speed or rhythm of the heartbeat. The monitor is a small, portable device. You can wear one while you do your normal daily activities. This is usually used to diagnose what is causing palpitations/syncope (passing out).   Follow-Up: Your physician recommends that you schedule a follow-up appointment in: 4 weeks   Any Other Special Instructions Will Be Listed Below (If Applicable).     If you need a refill on your cardiac medications before your next appointment, please call your pharmacy.

## 2017-08-21 ENCOUNTER — Other Ambulatory Visit: Payer: Self-pay

## 2017-08-21 DIAGNOSIS — I10 Essential (primary) hypertension: Secondary | ICD-10-CM

## 2017-08-21 DIAGNOSIS — R079 Chest pain, unspecified: Secondary | ICD-10-CM

## 2017-08-22 ENCOUNTER — Ambulatory Visit: Payer: 59

## 2017-08-22 DIAGNOSIS — R079 Chest pain, unspecified: Secondary | ICD-10-CM

## 2017-08-22 DIAGNOSIS — R002 Palpitations: Secondary | ICD-10-CM | POA: Diagnosis not present

## 2017-08-29 ENCOUNTER — Ambulatory Visit (HOSPITAL_COMMUNITY): Payer: 59 | Attending: Cardiology

## 2017-08-29 ENCOUNTER — Other Ambulatory Visit: Payer: Self-pay

## 2017-08-29 DIAGNOSIS — I10 Essential (primary) hypertension: Secondary | ICD-10-CM | POA: Diagnosis not present

## 2017-08-29 DIAGNOSIS — Z87891 Personal history of nicotine dependence: Secondary | ICD-10-CM | POA: Insufficient documentation

## 2017-08-29 DIAGNOSIS — R079 Chest pain, unspecified: Secondary | ICD-10-CM | POA: Diagnosis not present

## 2017-08-29 MED ORDER — PERFLUTREN LIPID MICROSPHERE
1.0000 mL | INTRAVENOUS | Status: AC | PRN
  Administered 2017-08-29: 1 mL via INTRAVENOUS

## 2017-08-31 ENCOUNTER — Telehealth: Payer: Self-pay

## 2017-08-31 NOTE — Telephone Encounter (Signed)
Informed patient of his holter monitor report. He did not have any further questions or concerns.

## 2017-09-13 ENCOUNTER — Other Ambulatory Visit: Payer: Self-pay | Admitting: Medical

## 2017-09-14 ENCOUNTER — Telehealth (HOSPITAL_COMMUNITY): Payer: Self-pay | Admitting: *Deleted

## 2017-09-14 NOTE — Telephone Encounter (Signed)
Patient given detailed instructions per Stress Test Requisition Sheet for test on 09/21/17 at 7:30.Patient Notified to arrive 30 minutes early, and that it is imperative to arrive on time for appointment to keep from having the test rescheduled.  Patient verbalized understanding. David Zuniga

## 2017-09-21 ENCOUNTER — Ambulatory Visit (HOSPITAL_COMMUNITY): Payer: 59 | Attending: Cardiovascular Disease

## 2017-09-21 ENCOUNTER — Ambulatory Visit (HOSPITAL_BASED_OUTPATIENT_CLINIC_OR_DEPARTMENT_OTHER): Payer: 59

## 2017-09-21 ENCOUNTER — Other Ambulatory Visit (HOSPITAL_COMMUNITY): Payer: 59

## 2017-09-21 DIAGNOSIS — R079 Chest pain, unspecified: Secondary | ICD-10-CM | POA: Diagnosis not present

## 2017-09-21 DIAGNOSIS — Z6841 Body Mass Index (BMI) 40.0 and over, adult: Secondary | ICD-10-CM | POA: Diagnosis not present

## 2017-09-21 DIAGNOSIS — I10 Essential (primary) hypertension: Secondary | ICD-10-CM | POA: Insufficient documentation

## 2017-09-21 DIAGNOSIS — Z87891 Personal history of nicotine dependence: Secondary | ICD-10-CM | POA: Insufficient documentation

## 2017-09-21 DIAGNOSIS — E669 Obesity, unspecified: Secondary | ICD-10-CM | POA: Insufficient documentation

## 2017-09-21 MED ORDER — PERFLUTREN LIPID MICROSPHERE
1.0000 mL | INTRAVENOUS | Status: AC | PRN
  Administered 2017-09-21: 3 mL via INTRAVENOUS

## 2017-09-26 ENCOUNTER — Ambulatory Visit (INDEPENDENT_AMBULATORY_CARE_PROVIDER_SITE_OTHER): Payer: 59 | Admitting: Cardiology

## 2017-09-26 ENCOUNTER — Encounter: Payer: Self-pay | Admitting: Cardiology

## 2017-09-26 VITALS — BP 132/78 | HR 87 | Ht 76.0 in | Wt 389.4 lb

## 2017-09-26 DIAGNOSIS — I10 Essential (primary) hypertension: Secondary | ICD-10-CM | POA: Diagnosis not present

## 2017-09-26 NOTE — Progress Notes (Signed)
Cardiology Office Note:    Date:  09/26/2017   ID:  David Zuniga, DOB 02/24/1986, MRN [id number]  PCP:  Mackie Pai, PA-C  Cardiologist:  Jenean Lindau, MD   Referring MD: Mackie Pai, PA-C    ASSESSMENT:    1. Essential hypertension   2. Severe obesity (BMI >= 40) (HCC)    PLAN:    In order of problems listed above:  1. I reassured the patient my findings. Primary prevention stressed to the patient. Importance of compliance with diet and medications stressed and he vocalized understanding. 2. Risks of obesity explained and he plans to aggressively lose weight and he has done an excellent job with exercise and diet in the past few weeks. 3. Patient will be seen in follow-up appointment in 6 months or earlier if the patient has any concerns.    Medication Adjustments/Labs and Tests Ordered: Current medicines are reviewed at length with the patient today.  Concerns regarding medicines are outlined above.  No orders of the defined types were placed in this encounter.  No orders of the defined types were placed in this encounter.    Chief Complaint  Patient presents with  . Follow-up    some better but still have about the same issues as before      History of Present Illness:    David Zuniga is a 31 y.o. male . The patient was evaluated by me for chest pain. His evaluation was unremarkable. His echocardiogram and stress echocardiogram was fine. His been exercising without any symptoms. He also had Holter monitor which was unremarkable. He denies any problems at this time he only has chest discomfort occasionally and this does not occur at all with exertion. At the time of my evaluation is alert awake oriented and in no distress.  Past Medical History:  Diagnosis Date  . Hypertension     History reviewed. No pertinent surgical history.  Current Medications: Current Meds  Medication Sig  . cholecalciferol (VITAMIN D) 1000 units tablet Take 1,000 Units by  mouth daily.  Marland Kitchen levocetirizine (XYZAL) 5 MG tablet Take 5 mg by mouth every evening.  . Multiple Vitamins-Minerals (MULTIVITAL) CHEW Chew 1 each by mouth.  . olmesartan-hydrochlorothiazide (BENICAR HCT) 40-25 MG tablet TAKE 1 TABLET BY MOUTH DAILY  . omeprazole (PRILOSEC) 10 MG capsule Take 1 capsule by mouth daily.     Allergies:   Patient has no known allergies.   Social History   Social History  . Marital status: Single    Spouse name: N/A  . Number of children: N/A  . Years of education: N/A   Social History Main Topics  . Smoking status: Never Smoker  . Smokeless tobacco: Never Used  . Alcohol use 0.0 oz/week     Comment: occ  . Drug use: No  . Sexual activity: Not Asked   Other Topics Concern  . None   Social History Narrative  . None     Family History: The patient's family history includes Alcohol abuse in his paternal grandfather; Bipolar disorder in his mother; Cancer (age of onset: 73) in his maternal grandfather; Heart disease in his paternal grandfather; Hypertension in his father and mother; Osteoporosis in his maternal grandmother.  ROS:   Please see the history of present illness.    All other systems reviewed and are negative.  EKGs/Labs/Other Studies Reviewed:    The following studies were reviewed today: I reviewed the results of the echocardiogram, stress echocardiogram and stress test and Holter  monitoring with the patient at extensive detail. Lipid work was also reviewed.   Recent Labs: 06/09/2017: ALT 59; TSH 3.05 07/20/2017: BUN 14; Creatinine, Ser 1.12; Hemoglobin 13.4; Platelets 341; Potassium 3.2; Sodium 137  Recent Lipid Panel    Component Value Date/Time   CHOL 127 06/09/2017 0916   TRIG 149.0 06/09/2017 0916   HDL 29.50 (L) 06/09/2017 0916   CHOLHDL 4 06/09/2017 0916   VLDL 29.8 06/09/2017 0916   LDLCALC 68 06/09/2017 0916    Physical Exam:    VS:  BP 132/78   Pulse 87   Ht 6\' 4"  (1.93 m)   Wt (!) 389 lb 6.4 oz (176.6 kg)    SpO2 98%   BMI 47.40 kg/m     Wt Readings from Last 3 Encounters:  09/26/17 (!) 389 lb 6.4 oz (176.6 kg)  08/15/17 (!) 407 lb 12.8 oz (185 kg)  08/09/17 (!) 410 lb 12.8 oz (186.3 kg)     GEN: Patient is in no acute distress HEENT: Normal NECK: No JVD; No carotid bruits LYMPHATICS: No lymphadenopathy CARDIAC: Hear sounds regular, 2/6 systolic murmur at the apex. RESPIRATORY:  Clear to auscultation without rales, wheezing or rhonchi  ABDOMEN: Soft, non-tender, non-distended MUSCULOSKELETAL:  No edema; No deformity  SKIN: Warm and dry NEUROLOGIC:  Alert and oriented x 3 PSYCHIATRIC:  Normal affect   Signed, Jenean Lindau, MD  09/26/2017 8:46 AM    [place]

## 2017-09-26 NOTE — Patient Instructions (Signed)
Medication Instructions:  Your physician recommends that you continue on your current medications as directed. Please refer to the Current Medication list given to you today.  Labwork: None  Testing/Procedures: None  Follow-Up: Your physician recommends that you schedule a follow-up appointment in: 6 months  Any Other Special Instructions Will Be Listed Below (If Applicable).     If you need a refill on your cardiac medications before your next appointment, please call your pharmacy.   [facility]  Lavontae Cornia A, RN, BSN  

## 2017-10-08 ENCOUNTER — Other Ambulatory Visit: Payer: Self-pay | Admitting: Medical

## 2017-10-13 ENCOUNTER — Telehealth: Payer: Self-pay | Admitting: Medical

## 2017-10-13 ENCOUNTER — Ambulatory Visit: Payer: 59 | Admitting: Medical

## 2017-10-13 ENCOUNTER — Encounter: Payer: Self-pay | Admitting: Medical

## 2017-10-13 VITALS — BP 131/81 | HR 77 | Temp 98.2°F | Resp 16 | Ht 76.0 in | Wt 384.8 lb

## 2017-10-13 DIAGNOSIS — E876 Hypokalemia: Secondary | ICD-10-CM

## 2017-10-13 DIAGNOSIS — M79604 Pain in right leg: Secondary | ICD-10-CM | POA: Diagnosis not present

## 2017-10-13 DIAGNOSIS — M79661 Pain in right lower leg: Secondary | ICD-10-CM

## 2017-10-13 DIAGNOSIS — M79605 Pain in left leg: Secondary | ICD-10-CM

## 2017-10-13 DIAGNOSIS — M79662 Pain in left lower leg: Secondary | ICD-10-CM | POA: Diagnosis not present

## 2017-10-13 DIAGNOSIS — M791 Myalgia, unspecified site: Secondary | ICD-10-CM | POA: Diagnosis not present

## 2017-10-13 LAB — COMPREHENSIVE METABOLIC PANEL
ALK PHOS: 80 U/L (ref 39–117)
ALT: 75 U/L — AB (ref 0–53)
AST: 36 U/L (ref 0–37)
Albumin: 4.5 g/dL (ref 3.5–5.2)
BILIRUBIN TOTAL: 0.7 mg/dL (ref 0.2–1.2)
BUN: 13 mg/dL (ref 6–23)
CO2: 26 meq/L (ref 19–32)
CREATININE: 0.98 mg/dL (ref 0.40–1.50)
Calcium: 9.9 mg/dL (ref 8.4–10.5)
Chloride: 103 mEq/L (ref 96–112)
GFR: 95.43 mL/min (ref >60.00)
GLUCOSE: 96 mg/dL (ref 70–99)
Potassium: 3.7 mEq/L (ref 3.5–5.1)
Sodium: 138 mEq/L (ref 135–145)
TOTAL PROTEIN: 7.8 g/dL (ref 6.0–8.3)

## 2017-10-13 LAB — D-DIMER, QUANTITATIVE: D-Dimer, Quant: 0.31 mcg/mL FEU (ref <0.50)

## 2017-10-13 LAB — SEDIMENTATION RATE: SED RATE: 32 mm/h — AB (ref 0–15)

## 2017-10-13 MED ORDER — DICLOFENAC SODIUM 75 MG PO TBEC: 75.0000 mg | DELAYED_RELEASE_TABLET | Freq: Two times a day (BID) | ORAL | 0 refills | Status: DC

## 2017-10-13 NOTE — Patient Instructions (Addendum)
For your bilateral calf pain recently, I ordered sed rate and d-dimer.  Also included on labs today metabolic panel to check his your potassium is still low.  If your d-dimer test were positive then we would need to get bilateral lower extremity ultrasounds to evaluate if you have DVT.  Presently I doubt DVT present but would need to rule out completely if d-dimer is positive.  Description of diclofenac written for pain and inflammation.  We will follow the sed rate.  Follow-up in 7-10 days or as needed.  Please keep your cell phone volume up better he charts so we can call you with the stat results.  Note regarding the transient very brief right pectoral region pain the other day, we need to watch this closely.  If you have recurrent pain is constant this would require evaluation in the emergency department.  Hopefully diclofenac that you are taking for your calf pain will treat potential pectoral muscle strain/pain.

## 2017-10-13 NOTE — Telephone Encounter (Signed)
Future sed rate placed

## 2017-10-13 NOTE — Telephone Encounter (Signed)
Reviewed patient's metabolic panel with him today.  Informed him of the mild LFT elevation.  Normal potassium level.  Normal d-dimer.  His sed rate was a little elevated and I advised him to continue the diclofenac and explained to repeat the sed rate in 10 days.  Call a couple of days in advance before the labs and be placed on the lab schedule.

## 2017-10-13 NOTE — Progress Notes (Signed)
Subjective:    Patient ID: David Zuniga, male    DOB: 1986/06/19, 31 y.o.   MRN: [id number]  HPI   Pt in states about one month ago he started to have some pain lower ext. Pain on and off in his calves. He describes as achy sensation. Location of pain changes. Pt has dull ache worse when sitting.   Pt notes preceding onset of lower ext pain after he was training 30 minutes a day for about one month and a half.   About 1 month since any bike training. No lower ext work outs for 2 weeks.  No shortness of breath. No swelling to calf. No discoloration.  Pt went to cardiologist for nonspecific chest pain. Pt had negative echo stress test.  Pt 2-3 days ago had rt upper pec twinge pain for 2-3 seconds then went away. No sob, no left side chest pain. No popliteal pain.  Pt had low k in past but no cramping.   Review of Systems  Constitutional: Negative for chills, fatigue and fever.  Respiratory: Negative for cough, chest tightness, shortness of breath and wheezing.   Cardiovascular: Negative for chest pain and palpitations.  Gastrointestinal: Negative for abdominal pain.  Musculoskeletal:       See HPI  Skin: Negative for rash.  Neurological: Negative for dizziness, syncope, weakness and light-headedness.  Hematological: Negative for adenopathy. Does not bruise/bleed easily.  Psychiatric/Behavioral: Negative for behavioral problems and confusion. The patient is not nervous/anxious.    Past Medical History:  Diagnosis Date  . Hypertension      Social History   Socioeconomic History  . Marital status: Single    Spouse name: Not on file  . Number of children: Not on file  . Years of education: Not on file  . Highest education level: Not on file  Social Needs  . Financial resource strain: Not on file  . Food insecurity - worry: Not on file  . Food insecurity - inability: Not on file  . Transportation needs - medical: Not on file  . Transportation needs - non-medical: Not on  file  Occupational History  . Not on file  Tobacco Use  . Smoking status: Never Smoker  . Smokeless tobacco: Never Used  Substance and Sexual Activity  . Alcohol use: Yes    Alcohol/week: 0.0 oz    Comment: occ  . Drug use: No  . Sexual activity: Not on file  Other Topics Concern  . Not on file  Social History Narrative  . Not on file    No past surgical history on file.  Family History  Problem Relation Age of Onset  . Hypertension Mother   . Bipolar disorder Mother        manic depressive  . Hypertension Father   . Cancer Maternal Grandfather 54       lung cancer  . Alcohol abuse Paternal Grandfather   . Heart disease Paternal Grandfather   . Osteoporosis Maternal Grandmother     No Known Allergies  Current Outpatient Medications on File Prior to Visit  Medication Sig Dispense Refill  . cholecalciferol (VITAMIN D) 1000 units tablet Take 1,000 Units by mouth daily.    Marland Kitchen levocetirizine (XYZAL) 5 MG tablet Take 5 mg by mouth every evening.    . Multiple Vitamins-Minerals (MULTIVITAL) CHEW Chew 1 each by mouth.    . olmesartan-hydrochlorothiazide (BENICAR HCT) 40-25 MG tablet TAKE 1 TABLET BY MOUTH DAILY 30 tablet 0  . omeprazole (PRILOSEC) 10 MG  capsule Take 1 capsule by mouth daily.     No current facility-administered medications on file prior to visit.     BP 131/81   Pulse 77   Temp 98.2 F (36.8 C) (Oral)   Resp 16   Ht 6\' 4"  (1.93 m)   Wt (!) 384 lb 12.8 oz (174.5 kg)   SpO2 98%   BMI 46.84 kg/m       Objective:   Physical Exam  General Mental Status- Alert. General Appearance- Not in acute distress.   Skin General: Color- Normal Color. Moisture- Normal Moisture.  Neck Carotid Arteries- Normal color. Moisture- Normal Moisture. No carotid bruits. No JVD.  Chest and Lung Exam Auscultation: Breath Sounds:-Normal.  Cardiovascular Auscultation:Rythm- Regular. Murmurs & Other Heart Sounds:Auscultation of the heart reveals- No  Murmurs.  Abdomen Inspection:-Inspeection Normal. Palpation/Percussion:Note:No mass. Palpation and Percussion of the abdomen reveal- Non Tender, Non Distended + BS, no rebound or guarding.    Neurologic Cranial Nerve exam:- CN III-XII intact(No nystagmus), symmetric smile. Strength:- 5/5 equal and symmetric strength both upper and lower extremities.  Lower ext- no pedal edema, no swelling. Faint medial calf not tender but faint ache at rest. Negative homans signs    Assessment & Plan:  For your bilateral calf pain recently, I ordered sed rate and d-dimer.  Also included on labs today metabolic panel to check his your potassium is still low.  If your d-dimer test were positive then we would need to get bilateral lower extremity ultrasounds to evaluate if you have DVT.  Presently I doubt DVT present but would need to rule out completely if d-dimer is positive.  Description of diclofenac written for pain and inflammation.  We will follow the sed rate.  Follow-up in 7-10 days or as needed.  Please keep your cell phone volume up better he charts so we can call you with the stat results.  Doneen Ollinger, Percell Miller, PA-C

## 2017-10-26 ENCOUNTER — Ambulatory Visit: Payer: 59 | Admitting: Medical

## 2017-10-26 ENCOUNTER — Encounter: Payer: Self-pay | Admitting: Medical

## 2017-10-26 VITALS — BP 136/84 | HR 80 | Temp 98.2°F | Resp 18 | Ht 76.0 in | Wt 388.0 lb

## 2017-10-26 DIAGNOSIS — M791 Myalgia, unspecified site: Secondary | ICD-10-CM | POA: Diagnosis not present

## 2017-10-26 DIAGNOSIS — M79669 Pain in unspecified lower leg: Secondary | ICD-10-CM | POA: Diagnosis not present

## 2017-10-26 NOTE — Progress Notes (Signed)
Subjective:    Patient ID: David Zuniga, male    DOB: Apr 27, 1986, 31 y.o.   MRN: [id number]  HPI  Pt in for follow up.   He still has achiness to medial calfs but other area of calfs as well sometimes. But mostly medial aspect pain. Worse pain with activity. He had one day of medial thigh pain but then that pain went away. No numbness or tingling in legs or feet.   No lower back pain. Pt not describing restless legs. Not describing any weakness.  Sed rate was elevated recently on last visit . D dimer was negative. So I did not do [place].   Pt did take a break from regular exercise(flat walk short distance only half a mile). Minimal activity will increase pain in both calfs.     Review of Systems  Constitutional: Negative for chills, diaphoresis, fatigue and fever.  Respiratory: Negative for cough, chest tightness, shortness of breath and wheezing.   Cardiovascular: Negative for chest pain and palpitations.  Gastrointestinal: Negative for abdominal pain, constipation, diarrhea and vomiting.  Musculoskeletal:       See HPI  Skin: Negative for rash.  Hematological: Negative for adenopathy. Does not bruise/bleed easily.  Psychiatric/Behavioral: Negative for behavioral problems, decreased concentration, dysphoric mood, self-injury and suicidal ideas. The patient is not nervous/anxious.    Past Medical History:  Diagnosis Date  . Hypertension      Social History   Socioeconomic History  . Marital status: Single    Spouse name: Not on file  . Number of children: Not on file  . Years of education: Not on file  . Highest education level: Not on file  Social Needs  . Financial resource strain: Not on file  . Food insecurity - worry: Not on file  . Food insecurity - inability: Not on file  . Transportation needs - medical: Not on file  . Transportation needs - non-medical: Not on file  Occupational History  . Not on file  Tobacco Use  . Smoking status: Never Smoker  . Smokeless  tobacco: Never Used  Substance and Sexual Activity  . Alcohol use: Yes    Alcohol/week: 0.0 oz    Comment: occ  . Drug use: No  . Sexual activity: Not on file  Other Topics Concern  . Not on file  Social History Narrative  . Not on file    No past surgical history on file.  Family History  Problem Relation Age of Onset  . Hypertension Mother   . Bipolar disorder Mother        manic depressive  . Hypertension Father   . Cancer Maternal Grandfather 46       lung cancer  . Alcohol abuse Paternal Grandfather   . Heart disease Paternal Grandfather   . Osteoporosis Maternal Grandmother     No Known Allergies  Current Outpatient Medications on File Prior to Visit  Medication Sig Dispense Refill  . cholecalciferol (VITAMIN D) 1000 units tablet Take 1,000 Units by mouth daily.    . diclofenac (VOLTAREN) 75 MG EC tablet Take 1 tablet (75 mg total) 2 (two) times daily by mouth. 30 tablet 0  . levocetirizine (XYZAL) 5 MG tablet Take 5 mg by mouth every evening.    . Multiple Vitamins-Minerals (MULTIVITAL) CHEW Chew 1 each by mouth.    . olmesartan-hydrochlorothiazide (BENICAR HCT) 40-25 MG tablet TAKE 1 TABLET BY MOUTH DAILY 30 tablet 0  . omeprazole (PRILOSEC) 10 MG capsule Take 1 capsule  by mouth daily.     No current facility-administered medications on file prior to visit.     BP 136/84 (BP Location: Left Arm, Patient Position: Sitting, Cuff Size: Large)   Pulse 80   Temp 98.2 F (36.8 C) (Oral)   Resp 18   Ht 6\' 4"  (1.93 m)   Wt (!) 388 lb (176 kg)   SpO2 95%   BMI 47.23 kg/m       Objective:   Physical Exam  General- No acute distress. Pleasant patient. Neck- Full range of motion, no jvd Lungs- Clear, even and unlabored. Heart- regular rate and rhythm. Neurologic- CNII- XII grossly intact. Lower extremity- no pedal edema.  Calves symmetric.  No redness warmth or tenderness.  No obvious tenderness to palpation presently.  Negative Homans sign.          Assessment & Plan:  Your bilateral calf myalgias/muscle aches are persisting and you did have some bilateral thigh muscle aches.  I was hopeful that with rest and diclofenac that your pain would resolve and that on repeat labs your sed rate would improve.  Since your symptoms are persisting, I am going to get magnesium level, ANA, aldolase, CK, lactose dehydrogenase and repeat sed rate.  You can go ahead and continue diclofenac.  When you are finished with diclofenac you could switch to ibuprofen over-the-counter.  After reviewing the above lab results might consider referral to specialist for further studies.  If you have any weakness with muscle aches EMG might be indicated.  Follow-up date to be determined after lab review  Nayib Remer, Percell Miller, [place]

## 2017-10-26 NOTE — Patient Instructions (Addendum)
Your bilateral calf myalgias/muscle aches are persisting and you did have some bilateral thigh muscle aches.  I was hopeful that with rest and diclofenac that your pain would resolve and that on repeat labs your sed rate would improve.  Since your symptoms are persisting, I am going to get magnesium level, ANA, aldolase, CK, lactose dehydrogenase and repeat sed rate.  You can go ahead and continue diclofenac.  When you are finished with diclofenac you could switch to ibuprofen over-the-counter.  After reviewing the above lab results might consider referral to specialist for further studies.  If you have any weakness with muscle aches EMG might be indicated.  Follow-up date to be determined after lab review

## 2017-10-27 LAB — MAGNESIUM: MAGNESIUM: 2.2 mg/dL (ref 1.5–2.5)

## 2017-10-27 LAB — SEDIMENTATION RATE: SED RATE: 17 mm/h — AB (ref 0–15)

## 2017-10-27 LAB — CK: CK TOTAL: 145 U/L (ref 7–232)

## 2017-11-01 ENCOUNTER — Encounter: Payer: Self-pay | Admitting: Medical

## 2017-11-02 ENCOUNTER — Telehealth: Payer: Self-pay | Admitting: Medical

## 2017-11-02 DIAGNOSIS — M79652 Pain in left thigh: Secondary | ICD-10-CM

## 2017-11-02 DIAGNOSIS — M79651 Pain in right thigh: Secondary | ICD-10-CM

## 2017-11-02 DIAGNOSIS — M79661 Pain in right lower leg: Secondary | ICD-10-CM

## 2017-11-02 DIAGNOSIS — M79662 Pain in left lower leg: Principal | ICD-10-CM

## 2017-11-02 LAB — ALDOLASE: Aldolase: 4.3 U/L (ref <=8.1)

## 2017-11-02 LAB — ANA: Anti Nuclear Antibody(ANA): NEGATIVE

## 2017-11-02 LAB — LACTATE DEHYDROGENASE: LDH: 128 U/L (ref 100–220)

## 2017-11-02 NOTE — Telephone Encounter (Signed)
Lower ext [place] order placed. Scheduled for 2:30 pm tomorrow. Will send pt my chart message. Will you call and make sure he got message.

## 2017-11-02 NOTE — Telephone Encounter (Signed)
Will tell him tomorrow morning that [place] booked. I took the appointment that was available. May have to arrange other location?

## 2017-11-02 NOTE — Telephone Encounter (Signed)
Pt is scheduled for tomorrow. Pt needs [place] to be done in the morning can not do afternoon appointment

## 2017-11-03 ENCOUNTER — Other Ambulatory Visit: Payer: Self-pay | Admitting: Medical

## 2017-11-03 ENCOUNTER — Ambulatory Visit (HOSPITAL_BASED_OUTPATIENT_CLINIC_OR_DEPARTMENT_OTHER): Payer: 59

## 2017-11-03 ENCOUNTER — Ambulatory Visit: Payer: 59

## 2017-11-03 ENCOUNTER — Encounter: Payer: Self-pay | Admitting: Medical

## 2017-11-03 ENCOUNTER — Ambulatory Visit (INDEPENDENT_AMBULATORY_CARE_PROVIDER_SITE_OTHER): Payer: 59

## 2017-11-03 ENCOUNTER — Ambulatory Visit: Payer: 59 | Admitting: Medical

## 2017-11-03 VITALS — BP 128/80 | HR 98 | Temp 98.1°F | Resp 16 | Ht 76.0 in | Wt 382.8 lb

## 2017-11-03 DIAGNOSIS — M25561 Pain in right knee: Secondary | ICD-10-CM

## 2017-11-03 DIAGNOSIS — M79652 Pain in left thigh: Secondary | ICD-10-CM

## 2017-11-03 DIAGNOSIS — M79605 Pain in left leg: Secondary | ICD-10-CM

## 2017-11-03 DIAGNOSIS — M79662 Pain in left lower leg: Principal | ICD-10-CM

## 2017-11-03 DIAGNOSIS — M25551 Pain in right hip: Secondary | ICD-10-CM

## 2017-11-03 DIAGNOSIS — M79661 Pain in right lower leg: Secondary | ICD-10-CM

## 2017-11-03 DIAGNOSIS — M79604 Pain in right leg: Secondary | ICD-10-CM | POA: Diagnosis not present

## 2017-11-03 DIAGNOSIS — M79651 Pain in right thigh: Secondary | ICD-10-CM

## 2017-11-03 DIAGNOSIS — M160 Bilateral primary osteoarthritis of hip: Secondary | ICD-10-CM | POA: Diagnosis not present

## 2017-11-03 MED ORDER — PREDNISONE 10 MG PO TABS: ORAL_TABLET | ORAL | 0 refills | Status: DC

## 2017-11-03 NOTE — Progress Notes (Signed)
Subjective:    Patient ID: David Zuniga, male    DOB: November 09, 1986, 31 y.o.   MRN: [id number]  HPI  Pr in for follow up.  He has been having on and off transient bilateral thigh pain recently. Alternating with calf pain. Since I last saw him he thigh pain most of day even at rest. No excacerbating factors. Then left thigh pain as well.  No shortness of breath.   He had some rt upper side chest pain mild intermittent. No shortness of breath. Faint transient low level upper rt side chest and back pain on Thursday. But non presently. Would only occur for a minute or 2.  Pt work up for muscle aches/myalgia negative except sed rate mild elevated.  Occasional twitch to calfs. Mg, k and calcium were all normal.  Also the other day hips were aching for a while. Today on exam transient rt knee pain.  Diclofenac and advil in past has not helped.   Review of Systems  Constitutional: Negative for chills, fatigue and fever.  HENT: Negative for congestion, drooling, facial swelling, postnasal drip, rhinorrhea and sinus pressure.   Respiratory: Negative for cough, chest tightness, shortness of breath and wheezing.   Cardiovascular: Negative for chest pain and palpitations.  Gastrointestinal: Negative for abdominal pain.  Musculoskeletal: Negative for back pain and myalgias.  Skin: Negative for rash.  Hematological: Negative for adenopathy. Does not bruise/bleed easily.  Psychiatric/Behavioral: Negative for behavioral problems, decreased concentration, self-injury and suicidal ideas. The patient is not nervous/anxious.     Past Medical History:  Diagnosis Date  . Hypertension      Social History   Socioeconomic History  . Marital status: Single    Spouse name: Not on file  . Number of children: Not on file  . Years of education: Not on file  . Highest education level: Not on file  Social Needs  . Financial resource strain: Not on file  . Food insecurity - worry: Not on file  . Food  insecurity - inability: Not on file  . Transportation needs - medical: Not on file  . Transportation needs - non-medical: Not on file  Occupational History  . Not on file  Tobacco Use  . Smoking status: Never Smoker  . Smokeless tobacco: Never Used  Substance and Sexual Activity  . Alcohol use: Yes    Alcohol/week: 0.0 oz    Comment: occ  . Drug use: No  . Sexual activity: Not on file  Other Topics Concern  . Not on file  Social History Narrative  . Not on file    No past surgical history on file.  Family History  Problem Relation Age of Onset  . Hypertension Mother   . Bipolar disorder Mother        manic depressive  . Hypertension Father   . Cancer Maternal Grandfather 16       lung cancer  . Alcohol abuse Paternal Grandfather   . Heart disease Paternal Grandfather   . Osteoporosis Maternal Grandmother     No Known Allergies  Current Outpatient Medications on File Prior to Visit  Medication Sig Dispense Refill  . cholecalciferol (VITAMIN D) 1000 units tablet Take 1,000 Units by mouth daily.    . diclofenac (VOLTAREN) 75 MG EC tablet Take 1 tablet (75 mg total) 2 (two) times daily by mouth. 30 tablet 0  . levocetirizine (XYZAL) 5 MG tablet Take 5 mg by mouth every evening.    . Multiple Vitamins-Minerals ([place]) CHEW  Chew 1 each by mouth.    . olmesartan-hydrochlorothiazide (BENICAR HCT) 40-25 MG tablet TAKE 1 TABLET BY MOUTH DAILY 30 tablet 0  . omeprazole (PRILOSEC) 10 MG capsule Take 1 capsule by mouth daily.     No current facility-administered medications on file prior to visit.     BP 128/80   Pulse 98   Temp 98.1 F (36.7 C) (Oral)   Resp 16   Ht 6\' 4"  (1.93 m)   Wt (!) 382 lb 12.8 oz (173.6 kg)   SpO2 98%   BMI 46.60 kg/m       Objective:   Physical Exam   General Mental Status- Alert. General Appearance- Not in acute distress.   Skin General: Color- Normal Color. Moisture- Normal Moisture.  Neck Carotid Arteries- Normal color.  Moisture- Normal Moisture. No carotid bruits. No JVD.  Chest and Lung Exam Auscultation: Breath Sounds:-Normal.  Cardiovascular Auscultation:Rythm- Regular. Murmurs & Other Heart Sounds:Auscultation of the heart reveals- No Murmurs.  Abdomen Inspection:-Inspeection Normal. Palpation/Percussion:Note:No mass. Palpation and Percussion of the abdomen reveal- Non Tender, Non Distended + BS, no rebound or guarding.  Neurologic Cranial Nerve exam:- CN III-XII intact(No nystagmus), symmetric smile. Strength:- 5/5 equal and symmetric strength both upper and lower extremities.    Lower extremity- no pedal edema.  Calves symmetric.  No redness warmth or tenderness.  No obvious tenderness to palpation presently.  Negative Homans sign.  Hips- no pain on palpation. No crepitus.     Assessment & Plan:  For your recent intermittent calf pain and thigh pain, we scheduled you to have bilateral lower extremity ultrasounds today at the [facility]  We will follow let you know those results when they are in.  For recent right hip pain and right knee pain decided to go ahead and get x-ray of both areas.  Since your sed rate has been elevated the last 2 times I am putting in future orders to repeat sed rate sometime late next week.  Also will add rheumatoid factor since the lab workup for myalgia did not include that.  Regarding your faint transient intermittent right upper chest pain(not present currently), I do not think workup of that is necessary currently as chest x-ray was negative in the past and relatively recent d-dimer was negative.  However if any serious findings on lower extremity ultrasound such as DVT then would definitely get imaging studies that/ASAP.  Since her muscle aches have not responded to NSAIDs, I am prescribing agrees taper dose of prednisone.  In light of your mild high sugar averages in the past when she did concentrate and eat low sugar diet over the next week  while on the prednisone.  Follow-up in 10 days or as needed.  Note again if workup is negative and no response to treatment will likely refer to rheumatologist  David Zuniga, David Miller, PA-C

## 2017-11-03 NOTE — Patient Instructions (Addendum)
For your recent intermittent calf pain and thigh pain, we scheduled you to have bilateral lower extremity ultrasounds today at the [facility].  We will follow let you know those results when they are in.  For recent right hip pain and right knee pain decided to go ahead and get x-ray of both areas.  Since her sed rate has been elevated the last 2 times I am putting in future orders to repeat sed rate sometime late next week.  Also will add rheumatoid factor since the lab workup for myalgia did not include that.  Regarding your faint transient intermittent right upper chest pain(not present currently), I do not think workup of that is necessary currently as chest x-ray was negative in the past and relatively recent d-dimer was negative.  However if any serious findings on lower extremity ultrasound such as DVT then would definitely get imaging studies that/ASAP.  Since your muscle aches have not responded to NSAIDs, I am prescribing agrees taper dose of prednisone.  In light of your mild high sugar averages in the past when she did concentrate and eat low sugar diet over the next week while on the prednisone.  Follow-up in 10 days or as needed.  Note again if workup is negative and no response to treatment will likely refer to rheumatologist

## 2017-11-03 NOTE — Telephone Encounter (Signed)
Pt has [place] scheduled today 9:30.

## 2017-11-04 ENCOUNTER — Encounter: Payer: Self-pay | Admitting: Medical

## 2017-11-04 ENCOUNTER — Other Ambulatory Visit: Payer: Self-pay | Admitting: Medical

## 2017-11-15 ENCOUNTER — Other Ambulatory Visit (INDEPENDENT_AMBULATORY_CARE_PROVIDER_SITE_OTHER): Payer: 59

## 2017-11-15 DIAGNOSIS — M25561 Pain in right knee: Secondary | ICD-10-CM

## 2017-11-15 DIAGNOSIS — M79605 Pain in left leg: Secondary | ICD-10-CM | POA: Diagnosis not present

## 2017-11-15 DIAGNOSIS — M79604 Pain in right leg: Secondary | ICD-10-CM

## 2017-11-15 LAB — SEDIMENTATION RATE: SED RATE: 10 mm/h (ref 0–15)

## 2017-11-16 ENCOUNTER — Encounter: Payer: Self-pay | Admitting: Medical

## 2017-11-16 LAB — RHEUMATOID FACTOR

## 2017-11-23 ENCOUNTER — Telehealth: Payer: Self-pay | Admitting: Medical

## 2017-11-23 DIAGNOSIS — R700 Elevated erythrocyte sedimentation rate: Secondary | ICD-10-CM

## 2017-11-23 DIAGNOSIS — M791 Myalgia, unspecified site: Secondary | ICD-10-CM

## 2017-12-07 ENCOUNTER — Other Ambulatory Visit: Payer: Self-pay | Admitting: Medical

## 2018-01-05 ENCOUNTER — Other Ambulatory Visit: Payer: Self-pay | Admitting: Medical

## 2018-02-05 ENCOUNTER — Ambulatory Visit: Payer: 59 | Admitting: Medical

## 2018-02-05 ENCOUNTER — Encounter: Payer: Self-pay | Admitting: Medical

## 2018-02-05 VITALS — BP 133/81 | HR 83 | Temp 98.7°F | Resp 16 | Ht 76.0 in | Wt 371.6 lb

## 2018-02-05 DIAGNOSIS — J029 Acute pharyngitis, unspecified: Secondary | ICD-10-CM | POA: Diagnosis not present

## 2018-02-05 DIAGNOSIS — J40 Bronchitis, not specified as acute or chronic: Secondary | ICD-10-CM | POA: Diagnosis not present

## 2018-02-05 DIAGNOSIS — R05 Cough: Secondary | ICD-10-CM

## 2018-02-05 DIAGNOSIS — R059 Cough, unspecified: Secondary | ICD-10-CM

## 2018-02-05 LAB — POCT RAPID STREP A (OFFICE): Rapid Strep A Screen: NEGATIVE

## 2018-02-05 MED ORDER — BENZONATATE 100 MG PO CAPS: 100.0000 mg | ORAL_CAPSULE | Freq: Three times a day (TID) | ORAL | 0 refills | Status: DC | PRN

## 2018-02-05 MED ORDER — OLMESARTAN MEDOXOMIL-HCTZ 40-25 MG PO TABS: 1.0000 | ORAL_TABLET | Freq: Every day | ORAL | 0 refills | Status: DC

## 2018-02-05 MED ORDER — AMOXICILLIN-POT CLAVULANATE 875-125 MG PO TABS: 1.0000 | ORAL_TABLET | Freq: Two times a day (BID) | ORAL | 0 refills | Status: DC

## 2018-02-05 MED ORDER — FLUTICASONE PROPIONATE 50 MCG/ACT NA SUSP: 2.0000 | Freq: Every day | NASAL | 1 refills | Status: DC

## 2018-02-05 NOTE — Progress Notes (Signed)
Subjective:    Patient ID: David Zuniga, male    DOB: 1985/12/31, 32 y.o.   MRN: [id number]  HPI  Pt states last Monday morning had moderate nasal congestion, st and faint chest congestion. Last Monday had severe st. Now st still hurts when swallows still. A week ago throat pain was moderate to severe and constant for about a day.  Cough at times is productive. No sinus pain but colored mucus when he blows his nose.  Pt states this is second time in past 3 weeks URI type illness/bronchitis   Review of Systems  Constitutional: Negative for chills, fatigue and fever.  HENT: Positive for congestion, sinus pressure, sinus pain and sore throat. Negative for facial swelling, mouth sores, postnasal drip, sneezing, tinnitus and voice change.   Respiratory: Negative for apnea, cough, shortness of breath and wheezing.   Cardiovascular: Negative for chest pain and palpitations.  Gastrointestinal: Negative for abdominal pain, anal bleeding, nausea and vomiting.  Musculoskeletal: Negative for arthralgias, back pain, myalgias and neck stiffness.  Skin: Negative for rash.  Neurological: Negative for dizziness, weakness, light-headedness, numbness and headaches.  Hematological: Negative for adenopathy. Does not bruise/bleed easily.  Psychiatric/Behavioral: Negative for behavioral problems and decreased concentration.    Past Medical History:  Diagnosis Date  . Hypertension      Social History   Socioeconomic History  . Marital status: Single    Spouse name: Not on file  . Number of children: Not on file  . Years of education: Not on file  . Highest education level: Not on file  Social Needs  . Financial resource strain: Not on file  . Food insecurity - worry: Not on file  . Food insecurity - inability: Not on file  . Transportation needs - medical: Not on file  . Transportation needs - non-medical: Not on file  Occupational History  . Not on file  Tobacco Use  . Smoking status:  Never Smoker  . Smokeless tobacco: Never Used  Substance and Sexual Activity  . Alcohol use: Yes    Alcohol/week: 0.0 oz    Comment: occ  . Drug use: No  . Sexual activity: Not on file  Other Topics Concern  . Not on file  Social History Narrative  . Not on file    No past surgical history on file.  Family History  Problem Relation Age of Onset  . Hypertension Mother   . Bipolar disorder Mother        manic depressive  . Hypertension Father   . Cancer Maternal Grandfather 58       lung cancer  . Alcohol abuse Paternal Grandfather   . Heart disease Paternal Grandfather   . Osteoporosis Maternal Grandmother     No Known Allergies  Current Outpatient Medications on File Prior to Visit  Medication Sig Dispense Refill  . cholecalciferol (VITAMIN D) 1000 units tablet Take 1,000 Units by mouth daily.    Marland Kitchen levocetirizine (XYZAL) 5 MG tablet Take 5 mg by mouth every evening.    . Multiple Vitamins-Minerals (MULTIVITAL) CHEW Chew 1 each by mouth.    Marland Kitchen omeprazole (PRILOSEC) 10 MG capsule Take 1 capsule by mouth daily.     No current facility-administered medications on file prior to visit.     BP 133/81   Pulse 83   Temp 98.7 F (37.1 C) (Oral)   Resp 16   Ht 6\' 4"  (1.93 m)   Wt (!) 371 lb 9.6 oz (168.6 kg)  SpO2 99%   BMI 45.23 kg/m       Objective:   Physical Exam   General  Mental Status - Alert. General Appearance - Well groomed. Not in acute distress.  Skin Rashes- No Rashes.  HEENT Head- Normal. Ear Auditory Canal - Left- Normal. Right - Normal.Tympanic Membrane- Left- Normal. Right- Normal. Eye Sclera/Conjunctiva- Left- Normal. Right- Normal. Nose & Sinuses Nasal Mucosa- Left-  Boggy and Congested. Right-  Boggy and  Congested.Bilateral no maxillary and no frontal sinus pressure. Mouth & Throat Lips: Upper Lip- Normal: no dryness, cracking, pallor, cyanosis, or vesicular eruption. Lower Lip-Normal: no dryness, cracking, pallor, cyanosis or  vesicular eruption. Buccal Mucosa- Bilateral- No Aphthous ulcers. Oropharynx- No Discharge or Erythema. Tonsils: Characteristics- Bilateral-  Erythema . Size/Enlargement- Bilateral- 2+ enlargement. Discharge- bilateral-None.  Neck Neck- Supple. No Masses.   Chest and Lung Exam Auscultation: Breath Sounds:-Clear even and unlabored.  Cardiovascular Auscultation:Rythm- Regular, rate and rhythm. Murmurs & Other Heart Sounds:Ausculatation of the heart reveal- No Murmurs.  Lymphatic Head & Neck General Head & Neck Lymphatics: Bilateral: Description- No Localized lymphadenopathy.      Assessment & Plan:  By exam throat looks suspicious for strep despite rapid test negative and I have  some concern for bronchitis type symptoms.  Will rx augmentin antibiotic. Flonase for congestion and benzonatate for cough.  If signs/sypmptoms persist or worsen please notify us.  Follow up in 7-10 days or as needed  [employer], [employer]

## 2018-02-05 NOTE — Patient Instructions (Addendum)
By exam throat looks suspicious for strep despite rapid test negative and I have  some concern for bronchitis type symptoms.  Will rx augmentin antibiotic. Flonase for congestion and benzonatate for cough.  If signs/sypmptoms persist or worsen please notify us.  Follow up in 7-10 days or as needed

## 2018-02-09 DIAGNOSIS — M79604 Pain in right leg: Secondary | ICD-10-CM | POA: Diagnosis not present

## 2018-02-09 DIAGNOSIS — M79605 Pain in left leg: Secondary | ICD-10-CM | POA: Diagnosis not present

## 2018-02-16 ENCOUNTER — Ambulatory Visit: Payer: 59 | Admitting: Medical

## 2018-02-16 ENCOUNTER — Encounter: Payer: Self-pay | Admitting: Medical

## 2018-02-16 VITALS — BP 136/82 | HR 97 | Resp 16 | Ht 76.0 in | Wt 371.0 lb

## 2018-02-16 DIAGNOSIS — H9202 Otalgia, left ear: Secondary | ICD-10-CM

## 2018-02-16 DIAGNOSIS — D492 Neoplasm of unspecified behavior of bone, soft tissue, and skin: Secondary | ICD-10-CM | POA: Diagnosis not present

## 2018-02-16 MED ORDER — DOXYCYCLINE HYCLATE 100 MG PO TABS: 100.0000 mg | ORAL_TABLET | Freq: Two times a day (BID) | ORAL | 0 refills | Status: DC

## 2018-02-16 MED ORDER — NEOMYCIN-POLYMYXIN-HC 3.5-10000-1 OT SOLN: 3.0000 [drp] | Freq: Four times a day (QID) | OTIC | 0 refills | Status: DC

## 2018-02-16 NOTE — Patient Instructions (Signed)
You do have apparent probable growth in your left ear canal.  On close inspection appears that the growth may be originating from the superior portion of the canal near the entrance of the canal.   I do think we need to refer you to a ENT relatively quickly.  Will put in the referral today and ask referral staff to call around to see who will see the quickest.  If you have any increasing pain, I did make doxycycline available to use if needed.  I do think it would be worthwhile trying to use Cortisporin otic drops to the ear.  I do not think the growth is obstructing the canal completely and it is possible that some of solution might decrease inflammation and help with pain.  If you have any increasing/severe pain associated with apparent growth then be seen in the ED.  Follow-up date to be determined.  But if there is a delay in referral to ENT and you need to be seen early next week that should not be a problem.

## 2018-02-16 NOTE — Progress Notes (Signed)
Subjective:    Patient ID: David Zuniga, male    DOB: May 07, 1986, 32 y.o.   MRN: [id number]  HPI  Pt states on Monday he started to feel blockage to left ear. He started to feel area swollen and felt something in his left ear. He states not wax like and when he poked at the area he saw speck of blood. Describes soft fleshy feeling lump.  The area hurts a little bit.  Some decreased hearing since growth in ear.   Pt was in just last week and ear looked normal per last  note.  Just finished augmentin for his sore throat. Throat now feels better.   Pt pain level is 2-3/10 at most.    Review of Systems  Constitutional: Negative for chills, fatigue and fever.  HENT: Positive for ear pain and nosebleeds. Negative for congestion, sinus pressure, sinus pain, sore throat and tinnitus.   Respiratory: Negative for cough, chest tightness, shortness of breath and wheezing.   Cardiovascular: Negative for chest pain and palpitations.  Musculoskeletal: Negative for back pain.  Skin: Negative for rash.  Neurological: Negative for dizziness, seizures, weakness and headaches.  Psychiatric/Behavioral: Negative for behavioral problems and confusion.    Past Medical History:  Diagnosis Date  . Hypertension      Social History   Socioeconomic History  . Marital status: Single    Spouse name: Not on file  . Number of children: Not on file  . Years of education: Not on file  . Highest education level: Not on file  Occupational History  . Not on file  Social Needs  . Financial resource strain: Not on file  . Food insecurity:    Worry: Not on file    Inability: Not on file  . Transportation needs:    Medical: Not on file    Non-medical: Not on file  Tobacco Use  . Smoking status: Never Smoker  . Smokeless tobacco: Never Used  Substance and Sexual Activity  . Alcohol use: Yes    Alcohol/week: 0.0 oz    Comment: occ  . Drug use: No  . Sexual activity: Not on file  Lifestyle  .  Physical activity:    Days per week: Not on file    Minutes per session: Not on file  . Stress: Not on file  Relationships  . Social connections:    Talks on phone: Not on file    Gets together: Not on file    Attends religious service: Not on file    Active member of club or organization: Not on file    Attends meetings of clubs or organizations: Not on file    Relationship status: Not on file  . Intimate partner violence:    Fear of current or ex partner: Not on file    Emotionally abused: Not on file    Physically abused: Not on file    Forced sexual activity: Not on file  Other Topics Concern  . Not on file  Social History Narrative  . Not on file    No past surgical history on file.  Family History  Problem Relation Age of Onset  . Hypertension Mother   . Bipolar disorder Mother        manic depressive  . Hypertension Father   . Cancer Maternal Grandfather 29       lung cancer  . Alcohol abuse Paternal Grandfather   . Heart disease Paternal Grandfather   . Osteoporosis Maternal Grandmother  No Known Allergies  Current Outpatient Medications on File Prior to Visit  Medication Sig Dispense Refill  . cholecalciferol (VITAMIN D) 1000 units tablet Take 1,000 Units by mouth daily.    . fluticasone (FLONASE) 50 MCG/ACT nasal spray Place 2 sprays into both nostrils daily. 16 g 1  . levocetirizine (XYZAL) 5 MG tablet Take 5 mg by mouth every evening.    . Multiple Vitamins-Minerals (MULTIVITAL) CHEW Chew 1 each by mouth.    . olmesartan-hydrochlorothiazide (BENICAR HCT) 40-25 MG tablet Take 1 tablet by mouth daily. 30 tablet 0  . omeprazole (PRILOSEC) 10 MG capsule Take 1 capsule by mouth daily.     No current facility-administered medications on file prior to visit.     BP 136/82 (BP Location: Left Arm, Patient Position: Sitting, Cuff Size: Large)   Pulse 97   Resp 16   Ht 6\' 4"  (1.93 m)   Wt (!) 371 lb (168.3 kg)   SpO2 99%   BMI 45.16 kg/m         Objective:   Physical Exam  General  Mental Status - Alert. General Appearance - Well groomed. Not in acute distress.  Skin Rashes- No Rashes.  HEENT Head- Normal. Ear Auditory Canal - Left- inside of canal has 4-5 mm fleshy growth at entrance of tympanic membrane. Difficult to examine features  as growth appears equal to width  of canal. Growth appearance to be occurring from superior portion of canal. Right - Normal.Tympanic Membrane- Left- Normal. Right- Normal. Eye Sclera/Conjunctiva- Left- Normal. Right- Normal. Nose & Sinuses Nasal Mucosa- Left-  Boggy and Congested. Right-  Boggy and  Congested.Bilateral maxillary and frontal sinus pressure. Mouth & Throat Lips: Upper Lip- Normal: no dryness, cracking, pallor, cyanosis, or vesicular eruption. Lower Lip-Normal: no dryness, cracking, pallor, cyanosis or vesicular eruption. Buccal Mucosa- Bilateral- No Aphthous ulcers. Oropharynx- No Discharge or Erythema. Tonsils: Characteristics- Bilateral- No Erythema or Congestion. Size/Enlargement- Bilateral- No enlargement. Discharge- bilateral-None.  Neck Neck- Supple. No Masses.   Chest and Lung Exam Auscultation: Breath Sounds:-Clear even and unlabored.  Cardiovascular Auscultation:Rythm- Regular, rate and rhythm. Murmurs & Other Heart Sounds:Ausculatation of the heart reveal- No Murmurs.  Lymphatic Head & Neck General Head & Neck Lymphatics: Bilateral: Description- No Localized lymphadenopathy.       Assessment & Plan:  You do have apparent probable growth in your left ear canal.  On close inspection appears that the growth may be originating from the superior portion of the canal near the entrance of the canal.   I do think we need to refer you to a ENT relatively quickly.  Will put in the referral today and ask referral staff to call around to see who will see the quickest.  If you have any increasing pain, I did make doxycycline available to use if needed.  I do think  it would be worthwhile trying to use Cortisporin otic drops to the ear.  I do not think the growth is obstructing the canal completely and it is possible that some of solution might decrease inflammation and help with pain.  If you have any increasing/severe pain associated with apparent growth then be seen in the ED.  Follow-up date to be determined.  But if there is a delay in referral to ENT and you need to be seen early next week that should not be a problem.  Mackie Pai, PA-C

## 2018-02-26 DIAGNOSIS — J351 Hypertrophy of tonsils: Secondary | ICD-10-CM | POA: Diagnosis not present

## 2018-02-26 DIAGNOSIS — L738 Other specified follicular disorders: Secondary | ICD-10-CM | POA: Diagnosis not present

## 2018-02-26 DIAGNOSIS — Z570 Occupational exposure to noise: Secondary | ICD-10-CM | POA: Diagnosis not present

## 2018-02-26 DIAGNOSIS — H6192 Disorder of left external ear, unspecified: Secondary | ICD-10-CM | POA: Insufficient documentation

## 2018-02-26 DIAGNOSIS — D2322 Other benign neoplasm of skin of left ear and external auricular canal: Secondary | ICD-10-CM | POA: Diagnosis not present

## 2018-03-04 ENCOUNTER — Other Ambulatory Visit: Payer: Self-pay | Admitting: Medical

## 2018-03-07 NOTE — Telephone Encounter (Signed)
Received call from Todd at [facility] f/u on status of olmesartan RX request that he states has been sent 2x. Call back # [phone].  [id number] [address], [place]  [place] [address]  Phone: [phone] Fax: [phone]

## 2018-03-07 NOTE — Telephone Encounter (Signed)
Benicar HCT refill request  LOV 06/09/17 with Mackie Pai  [facility] [address], [place].  Todd at [facility] called following up on the status of this med.   It has been sent 2X.    Call back is [phone]

## 2018-03-22 DIAGNOSIS — E559 Vitamin D deficiency, unspecified: Secondary | ICD-10-CM | POA: Insufficient documentation

## 2018-03-26 ENCOUNTER — Encounter: Payer: Self-pay | Admitting: Cardiology

## 2018-03-26 ENCOUNTER — Ambulatory Visit: Payer: 59 | Admitting: Cardiology

## 2018-03-26 VITALS — BP 132/80 | HR 77 | Ht 76.0 in | Wt 363.8 lb

## 2018-03-26 DIAGNOSIS — E663 Overweight: Secondary | ICD-10-CM | POA: Insufficient documentation

## 2018-03-26 DIAGNOSIS — I10 Essential (primary) hypertension: Secondary | ICD-10-CM

## 2018-03-26 NOTE — Patient Instructions (Signed)
Medication Instructions:  Your physician recommends that you continue on your current medications as directed. Please refer to the Current Medication list given to you today.  Labwork: Your physician recommends that you have the following labs drawn: BMP  Testing/Procedures: None  Follow-Up: Your physician recommends that you schedule a follow-up appointment in: 6 months  Any Other Special Instructions Will Be Listed Below (If Applicable).     If you need a refill on your cardiac medications before your next appointment, please call your pharmacy.   [facility]  Ashley A, RN, BSN  

## 2018-03-26 NOTE — Progress Notes (Signed)
Cardiology Office Note:    Date:  03/26/2018   ID:  David Zuniga, DOB Aug 11, 1986, MRN [id number]  PCP:  Mackie Pai, PA-C  Cardiologist:  Jenean Lindau, MD   Referring MD: Mackie Pai, PA-C    ASSESSMENT:    1. Essential hypertension   2. Overweight    PLAN:    In order of problems listed above:  1. Primary prevention stressed with the patient at length.  Importance of compliance with diet and medication stressed and he vocalized understanding. 2. His blood pressure is stable diet was discussed with dyslipidemia and obesity.  Risks of obesity revisited again and he plans to be more aggressive with weight loss and exercise 3. Since he is on medicines for hypertension will Chem-7. 4. Patient will be seen in follow-up appointment in 6 months or earlier if the patient has any concerns    Medication Adjustments/Labs and Tests Ordered: Current medicines are reviewed at length with the patient today.  Concerns regarding medicines are outlined above.  Orders Placed This Encounter  Procedures  . Basic metabolic panel   No orders of the defined types were placed in this encounter.    Chief Complaint  Patient presents with  . Follow-up  . Hypertension     History of Present Illness:    David Zuniga is a 32 y.o. male.  The patient has history of essential hypertension and obesity.  He denies any problems at this time and takes care of activities of daily living.  No chest pain orthopnea or PND.  He is happy that his diet and exercise is helping him lose weight.  Past Medical History:  Diagnosis Date  . Hypertension     History reviewed. No pertinent surgical history.  Current Medications: Current Meds  Medication Sig  . cholecalciferol (VITAMIN D) 1000 units tablet Take 1,000 Units by mouth daily.  Marland Kitchen levocetirizine (XYZAL) 5 MG tablet Take 5 mg by mouth every evening.  . Multiple Vitamins-Minerals (MULTIVITAL) CHEW Chew 1 each by mouth daily.   Marland Kitchen  olmesartan-hydrochlorothiazide (BENICAR HCT) 40-25 MG tablet Take 1 tablet by mouth daily.  Marland Kitchen omeprazole (PRILOSEC) 10 MG capsule Take 1 capsule by mouth daily.  . [DISCONTINUED] amoxicillin-clavulanate (AUGMENTIN) 875-125 MG tablet   . [DISCONTINUED] benzonatate (TESSALON) 100 MG capsule      Allergies:   Patient has no known allergies.   Social History   Socioeconomic History  . Marital status: Single    Spouse name: Not on file  . Number of children: Not on file  . Years of education: Not on file  . Highest education level: Not on file  Occupational History  . Not on file  Social Needs  . Financial resource strain: Not on file  . Food insecurity:    Worry: Not on file    Inability: Not on file  . Transportation needs:    Medical: Not on file    Non-medical: Not on file  Tobacco Use  . Smoking status: Never Smoker  . Smokeless tobacco: Never Used  Substance and Sexual Activity  . Alcohol use: Yes    Alcohol/week: 0.0 oz    Comment: occ  . Drug use: No  . Sexual activity: Not on file  Lifestyle  . Physical activity:    Days per week: Not on file    Minutes per session: Not on file  . Stress: Not on file  Relationships  . Social connections:    Talks on phone: Not on file  Gets together: Not on file    Attends religious service: Not on file    Active member of club or organization: Not on file    Attends meetings of clubs or organizations: Not on file    Relationship status: Not on file  Other Topics Concern  . Not on file  Social History Narrative  . Not on file     Family History: The patient's family history includes Alcohol abuse in his paternal grandfather; Bipolar disorder in his mother; Cancer (age of onset: 15) in his maternal grandfather; Heart disease in his paternal grandfather; Hypertension in his father and mother; Osteoporosis in his maternal grandmother.  ROS:   Please see the history of present illness.    All other systems reviewed and are  negative.  EKGs/Labs/Other Studies Reviewed:    The following studies were reviewed today: I reviewed the findings of echocardiogram with   Recent Labs: 06/09/2017: TSH 3.05 07/20/2017: Hemoglobin 13.4; Platelets 341 10/13/2017: ALT 75; BUN 13; Creatinine, Ser 0.98; Potassium 3.7; Sodium 138 10/26/2017: Magnesium 2.2  Recent Lipid Panel    Component Value Date/Time   CHOL 127 06/09/2017 0916   TRIG 149.0 06/09/2017 0916   HDL 29.50 (L) 06/09/2017 0916   CHOLHDL 4 06/09/2017 0916   VLDL 29.8 06/09/2017 0916   LDLCALC 68 06/09/2017 0916    Physical Exam:    VS:  BP 132/80 (BP Location: Left Arm, Patient Position: Sitting, Cuff Size: Normal)   Pulse 77   Ht 6\' 4"  (1.93 m)   Wt (!) 363 lb 12.8 oz (165 kg)   SpO2 98%   BMI 44.28 kg/m     Wt Readings from Last 3 Encounters:  03/26/18 (!) 363 lb 12.8 oz (165 kg)  02/16/18 (!) 371 lb (168.3 kg)  02/05/18 (!) 371 lb 9.6 oz (168.6 kg)     GEN: Patient is in no acute distress HEENT: Normal NECK: No JVD; No carotid bruits LYMPHATICS: No lymphadenopathy CARDIAC: Hear sounds regular, 2/6 systolic murmur at the apex. RESPIRATORY:  Clear to auscultation without rales, wheezing or rhonchi  ABDOMEN: Soft, non-tender, non-distended MUSCULOSKELETAL:  No edema; No deformity  SKIN: Warm and dry NEUROLOGIC:  Alert and oriented x 3 PSYCHIATRIC:  Normal affect   Signed, Jenean Lindau, MD  03/26/2018 10:50 AM    [place]

## 2018-03-27 LAB — BASIC METABOLIC PANEL
BUN/Creatinine Ratio: 13 (ref 9–20)
BUN: 13 mg/dL (ref 6–20)
CO2: 24 mmol/L (ref 20–29)
CREATININE: 0.98 mg/dL (ref 0.76–1.27)
Calcium: 10 mg/dL (ref 8.7–10.2)
Chloride: 102 mmol/L (ref 96–106)
GFR calc Af Amer: 119 mL/min/{1.73_m2} (ref >59)
GFR calc non Af Amer: 103 mL/min/{1.73_m2} (ref >59)
GLUCOSE: 76 mg/dL (ref 65–99)
Potassium: 4.5 mmol/L (ref 3.5–5.2)
SODIUM: 142 mmol/L (ref 134–144)

## 2018-06-04 ENCOUNTER — Ambulatory Visit (HOSPITAL_BASED_OUTPATIENT_CLINIC_OR_DEPARTMENT_OTHER)
Admission: RE | Admit: 2018-06-04 | Discharge: 2018-06-04 | Disposition: A | Discharge status: Home / Self Care | Payer: 59 | Source: Ambulatory Visit | Attending: Medical | Admitting: Medical

## 2018-06-04 ENCOUNTER — Ambulatory Visit: Payer: 59 | Admitting: Medical

## 2018-06-04 ENCOUNTER — Encounter: Payer: Self-pay | Admitting: Medical

## 2018-06-04 VITALS — BP 139/86 | HR 99 | Temp 99.2°F | Resp 16 | Ht 76.0 in | Wt 370.0 lb

## 2018-06-04 DIAGNOSIS — R05 Cough: Secondary | ICD-10-CM | POA: Diagnosis not present

## 2018-06-04 DIAGNOSIS — R059 Cough, unspecified: Secondary | ICD-10-CM

## 2018-06-04 DIAGNOSIS — R252 Cramp and spasm: Secondary | ICD-10-CM

## 2018-06-04 DIAGNOSIS — R509 Fever, unspecified: Secondary | ICD-10-CM | POA: Diagnosis not present

## 2018-06-04 MED ORDER — BENZONATATE 100 MG PO CAPS: 100.0000 mg | ORAL_CAPSULE | Freq: Three times a day (TID) | ORAL | 0 refills | Status: DC | PRN

## 2018-06-04 MED ORDER — AZITHROMYCIN 250 MG PO TABS: ORAL_TABLET | ORAL | 0 refills | Status: DC

## 2018-06-04 NOTE — Progress Notes (Signed)
Subjective:    Patient ID: David Zuniga, male    DOB: 1986/01/05, 32 y.o.   MRN: [id number]  HPI    Pt in with cough since last week. He has had on and off dry and productive cough. Pt states on Friday night he got fever of 101.  Denies any sinus pain or pressure. He does report mild constant dull ha. No st. No diffuse body aches.  Feels mild fatigued now. Moderate this weekend.  On review maybe faint very low level sore throat 1/10 level.     Review of Systems  Constitutional: Positive for diaphoresis and fatigue. Negative for chills and fever.  HENT: Negative for congestion, ear discharge, ear pain, facial swelling, hearing loss, sore throat and trouble swallowing.        Faint st 1/10 at best.  Respiratory: Negative for chest tightness, shortness of breath and wheezing.   Cardiovascular: Negative for chest pain and palpitations.  Gastrointestinal: Negative for abdominal distention, abdominal pain and constipation.  Musculoskeletal: Negative for back pain and myalgias.       Cramp neck muscle on and off.  Skin: Negative for rash.  Neurological: Negative for dizziness, speech difficulty, weakness, numbness and headaches.  Hematological: Negative for adenopathy. Does not bruise/bleed easily.  Psychiatric/Behavioral: Negative for agitation, confusion and suicidal ideas. The patient is not nervous/anxious and is not hyperactive.      Past Medical History:  Diagnosis Date  . Hypertension      Social History   Socioeconomic History  . Marital status: Single    Spouse name: Not on file  . Number of children: Not on file  . Years of education: Not on file  . Highest education level: Not on file  Occupational History  . Not on file  Social Needs  . Financial resource strain: Not on file  . Food insecurity:    Worry: Not on file    Inability: Not on file  . Transportation needs:    Medical: Not on file    Non-medical: Not on file  Tobacco Use  . Smoking status:  Never Smoker  . Smokeless tobacco: Never Used  Substance and Sexual Activity  . Alcohol use: Yes    Alcohol/week: 0.0 oz    Comment: occ  . Drug use: No  . Sexual activity: Not on file  Lifestyle  . Physical activity:    Days per week: Not on file    Minutes per session: Not on file  . Stress: Not on file  Relationships  . Social connections:    Talks on phone: Not on file    Gets together: Not on file    Attends religious service: Not on file    Active member of club or organization: Not on file    Attends meetings of clubs or organizations: Not on file    Relationship status: Not on file  . Intimate partner violence:    Fear of current or ex partner: Not on file    Emotionally abused: Not on file    Physically abused: Not on file    Forced sexual activity: Not on file  Other Topics Concern  . Not on file  Social History Narrative  . Not on file    No past surgical history on file.  Family History  Problem Relation Age of Onset  . Hypertension Mother   . Bipolar disorder Mother        manic depressive  . Hypertension Father   . Cancer  Maternal Grandfather 80       lung cancer  . Alcohol abuse Paternal Grandfather   . Heart disease Paternal Grandfather   . Osteoporosis Maternal Grandmother     No Known Allergies  Current Outpatient Medications on File Prior to Visit  Medication Sig Dispense Refill  . cholecalciferol (VITAMIN D) 1000 units tablet Take 1,000 Units by mouth daily.    Marland Kitchen levocetirizine (XYZAL) 5 MG tablet Take 5 mg by mouth every evening.    . Multiple Vitamins-Minerals (MULTIVITAL) CHEW Chew 1 each by mouth daily.     Marland Kitchen olmesartan-hydrochlorothiazide (BENICAR HCT) 40-25 MG tablet Take 1 tablet by mouth daily. 30 tablet 3  . omeprazole (PRILOSEC) 10 MG capsule Take 1 capsule by mouth daily.     No current facility-administered medications on file prior to visit.     BP 139/86   Pulse 99   Temp 99.2 F (37.3 C) (Oral)   Resp 16   Ht 6\' 4"   (1.93 m)   Wt (!) 370 lb (167.8 kg)   SpO2 98%   BMI 45.04 kg/m       Objective:   Physical Exam  General  Mental Status - Alert. General Appearance - Well groomed. Not in acute distress.  Skin Rashes- No Rashes. But can see sweat beaded up on forehead.  HEENT Head- Normal. Ear Auditory Canal - Left- Normal. Right - Normal.Tympanic Membrane- Left- Normal. Right- Normal. Eye Sclera/Conjunctiva- Left- Normal. Right- Normal. Nose & Sinuses Nasal Mucosa- Left-  Boggy and Congested. Right-  Boggy and  Congested.Bilateral maxillary and frontal sinus pressure. Mouth & Throat Lips: Upper Lip- Normal: no dryness, cracking, pallor, cyanosis, or vesicular eruption. Lower Lip-Normal: no dryness, cracking, pallor, cyanosis or vesicular eruption. Buccal Mucosa- Bilateral- No Aphthous ulcers. Oropharynx- No Discharge or Erythema. Tonsils: Characteristics- Bilateral- No Erythema or Congestion. Size/Enlargement- Bilateral- No enlargement. Discharge- bilateral-None.  Neck Neck- Supple. No Masses.   Chest and Lung Exam Auscultation: Breath Sounds:-Clear even and unlabored.  Cardiovascular Auscultation:Rythm- Regular, rate and rhythm. Murmurs & Other Heart Sounds:Ausculatation of the heart reveal- No Murmurs.  Lymphatic Head & Neck General Head & Neck Lymphatics: Bilateral: Description- No Localized lymphadenopathy.      Assessment & Plan:  For your cough, fever, sweating and faint sore throat, I put in chest x-ray order, send out throat culture and place lab order for CBC.  Considering that you might have strep throat versus walking pneumonia.  I prescribed a azithromycin today.  If chest x-ray were to come back positive would add Augmentin antibiotic to a azithromycin.  For your recent intermittent muscle cramps in the neck muscles/sternocleidomastoid regions I placed order for metabolic panel and magnesium level.  Follow-up in 7 to 10 days or as needed.  For cough med  benzonatate print prescription available to use if needed.  Mackie Pai, PA-C

## 2018-06-04 NOTE — Patient Instructions (Signed)
For your cough, fever, sweating and faint sore throat, I put in chest x-ray order, send out throat culture and place lab order for CBC.  Considering that you might have strep throat versus walking pneumonia.  I prescribed a azithromycin today.  If chest x-ray were to come back positive would add Augmentin antibiotic to a azithromycin.  For your recent intermittent muscle cramps in the neck muscles/sternocleidomastoid regions I placed order for metabolic panel and magnesium level.  Follow-up in 7 to 10 days or as needed.

## 2018-06-05 LAB — CBC WITH DIFFERENTIAL/PLATELET
BASOS PCT: 0.9 % (ref 0.0–3.0)
Basophils Absolute: 0.1 10*3/uL (ref 0.0–0.1)
EOS ABS: 0.4 10*3/uL (ref 0.0–0.7)
EOS PCT: 4.7 % (ref 0.0–5.0)
HCT: 46.4 % (ref 39.0–52.0)
Hemoglobin: 16 g/dL (ref 13.0–17.0)
LYMPHS ABS: 3.3 10*3/uL (ref 0.7–4.0)
Lymphocytes Relative: 40.1 % (ref 12.0–46.0)
MCHC: 34.5 g/dL (ref 30.0–36.0)
MCV: 87.4 fl (ref 78.0–100.0)
MONO ABS: 0.7 10*3/uL (ref 0.1–1.0)
Monocytes Relative: 8 % (ref 3.0–12.0)
Neutro Abs: 3.8 10*3/uL (ref 1.4–7.7)
Neutrophils Relative %: 46.3 % (ref 43.0–77.0)
PLATELETS: 326 10*3/uL (ref 150.0–400.0)
RBC: 5.3 Mil/uL (ref 4.22–5.81)
RDW: 13 % (ref 11.5–15.5)
WBC: 8.2 10*3/uL (ref 4.0–10.5)

## 2018-06-05 LAB — COMPREHENSIVE METABOLIC PANEL
ALT: 44 U/L (ref 0–53)
AST: 27 U/L (ref 0–37)
Albumin: 4.4 g/dL (ref 3.5–5.2)
Alkaline Phosphatase: 79 U/L (ref 39–117)
BUN: 13 mg/dL (ref 6–23)
CHLORIDE: 101 meq/L (ref 96–112)
CO2: 29 meq/L (ref 19–32)
CREATININE: 0.96 mg/dL (ref 0.40–1.50)
Calcium: 9.3 mg/dL (ref 8.4–10.5)
GFR: 97.31 mL/min (ref >60.00)
Glucose, Bld: 94 mg/dL (ref 70–99)
POTASSIUM: 4 meq/L (ref 3.5–5.1)
SODIUM: 138 meq/L (ref 135–145)
Total Bilirubin: 0.5 mg/dL (ref 0.2–1.2)
Total Protein: 7.5 g/dL (ref 6.0–8.3)

## 2018-06-05 LAB — MAGNESIUM: MAGNESIUM: 2 mg/dL (ref 1.5–2.5)

## 2018-06-06 ENCOUNTER — Encounter: Payer: Self-pay | Admitting: Medical

## 2018-06-06 LAB — CULTURE, GROUP A STREP
MICRO NUMBER: 90805013
SPECIMEN QUALITY: ADEQUATE

## 2018-07-15 ENCOUNTER — Other Ambulatory Visit: Payer: Self-pay | Admitting: Medical

## 2018-09-08 IMAGING — CR DG CHEST 2V
2 series · 2 of 2 positions shown · non-contrast
Comparison: None.

CLINICAL DATA: Chest pain, left-sided radiating to the back.

EXAM:
CHEST  2 VIEW

[w chest pa]
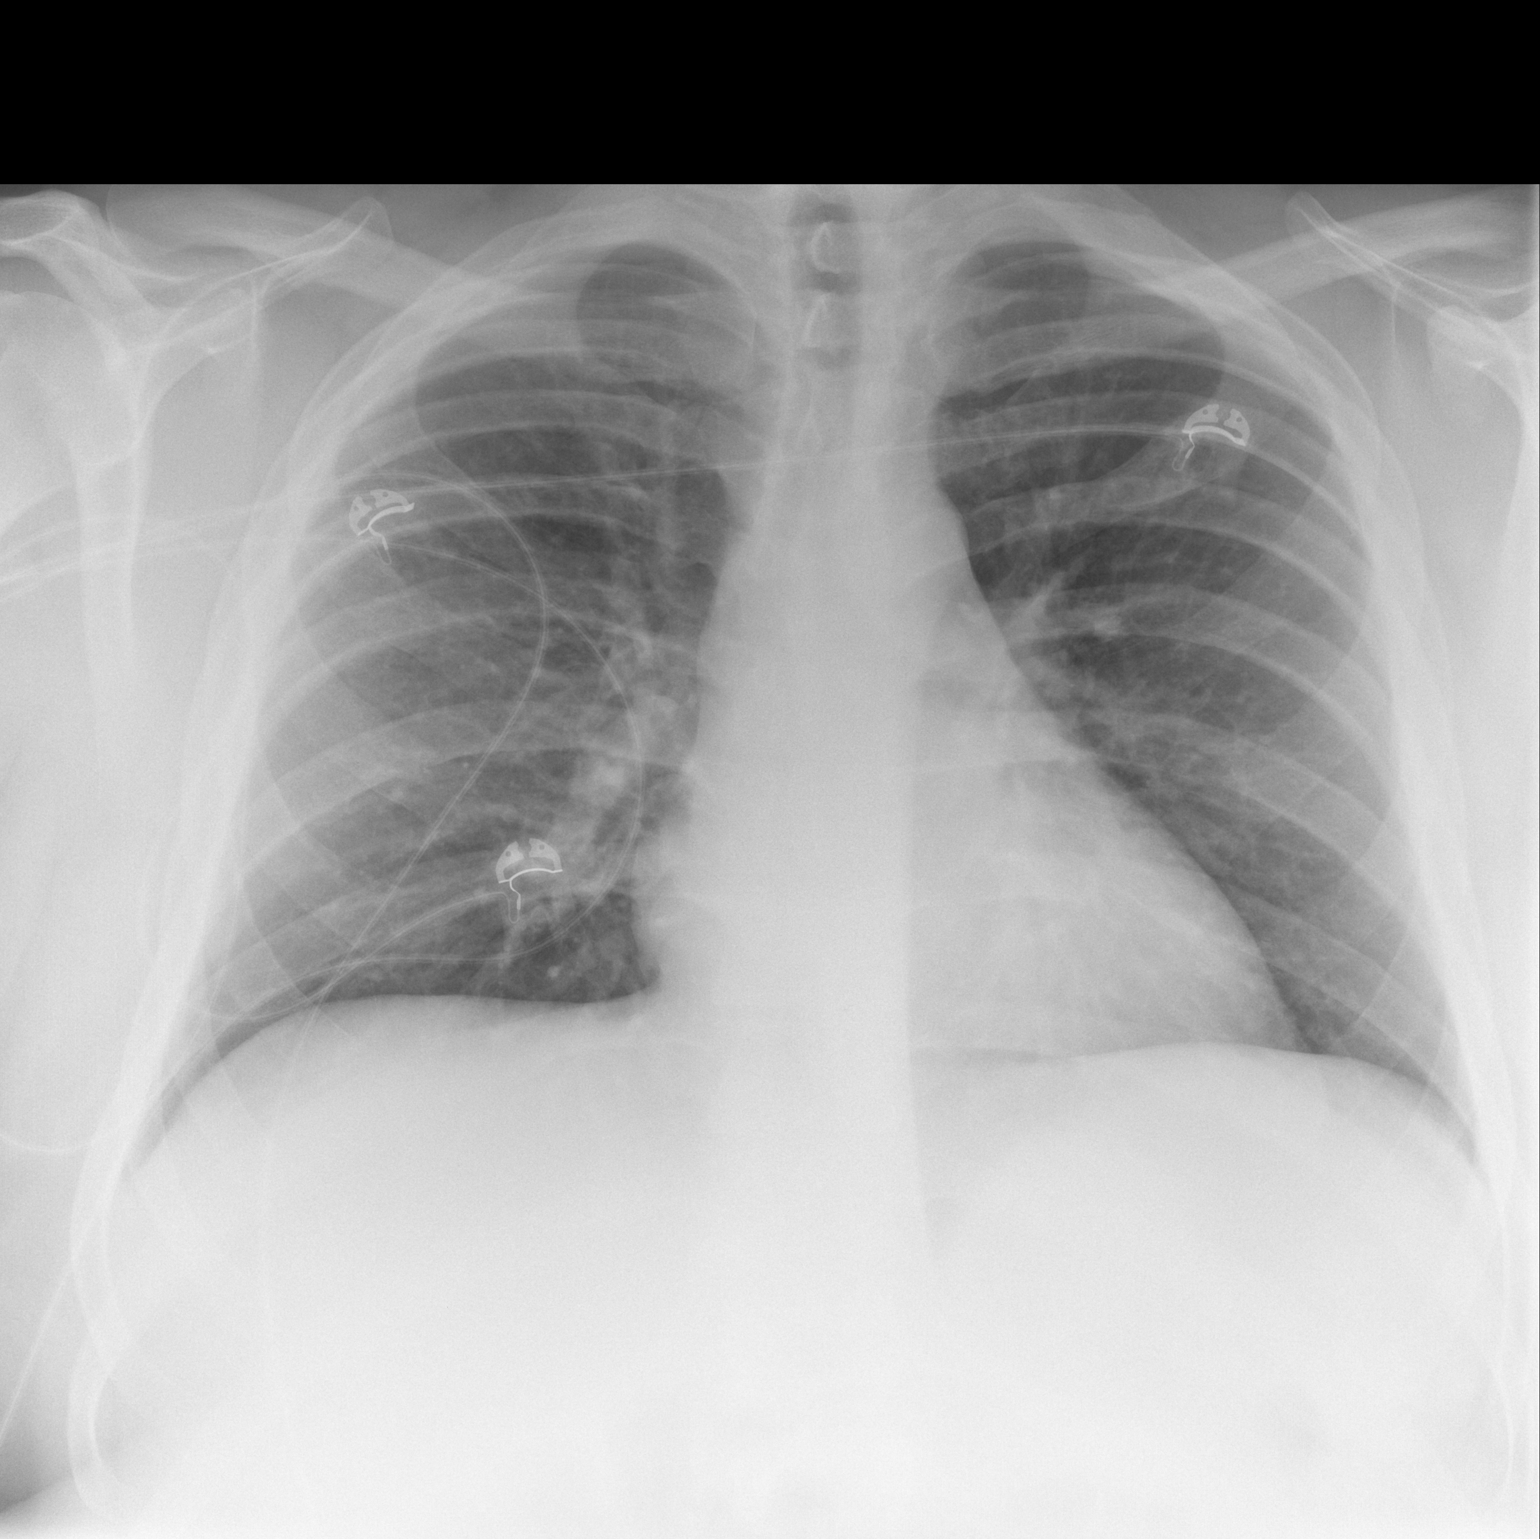

[w chest lat]
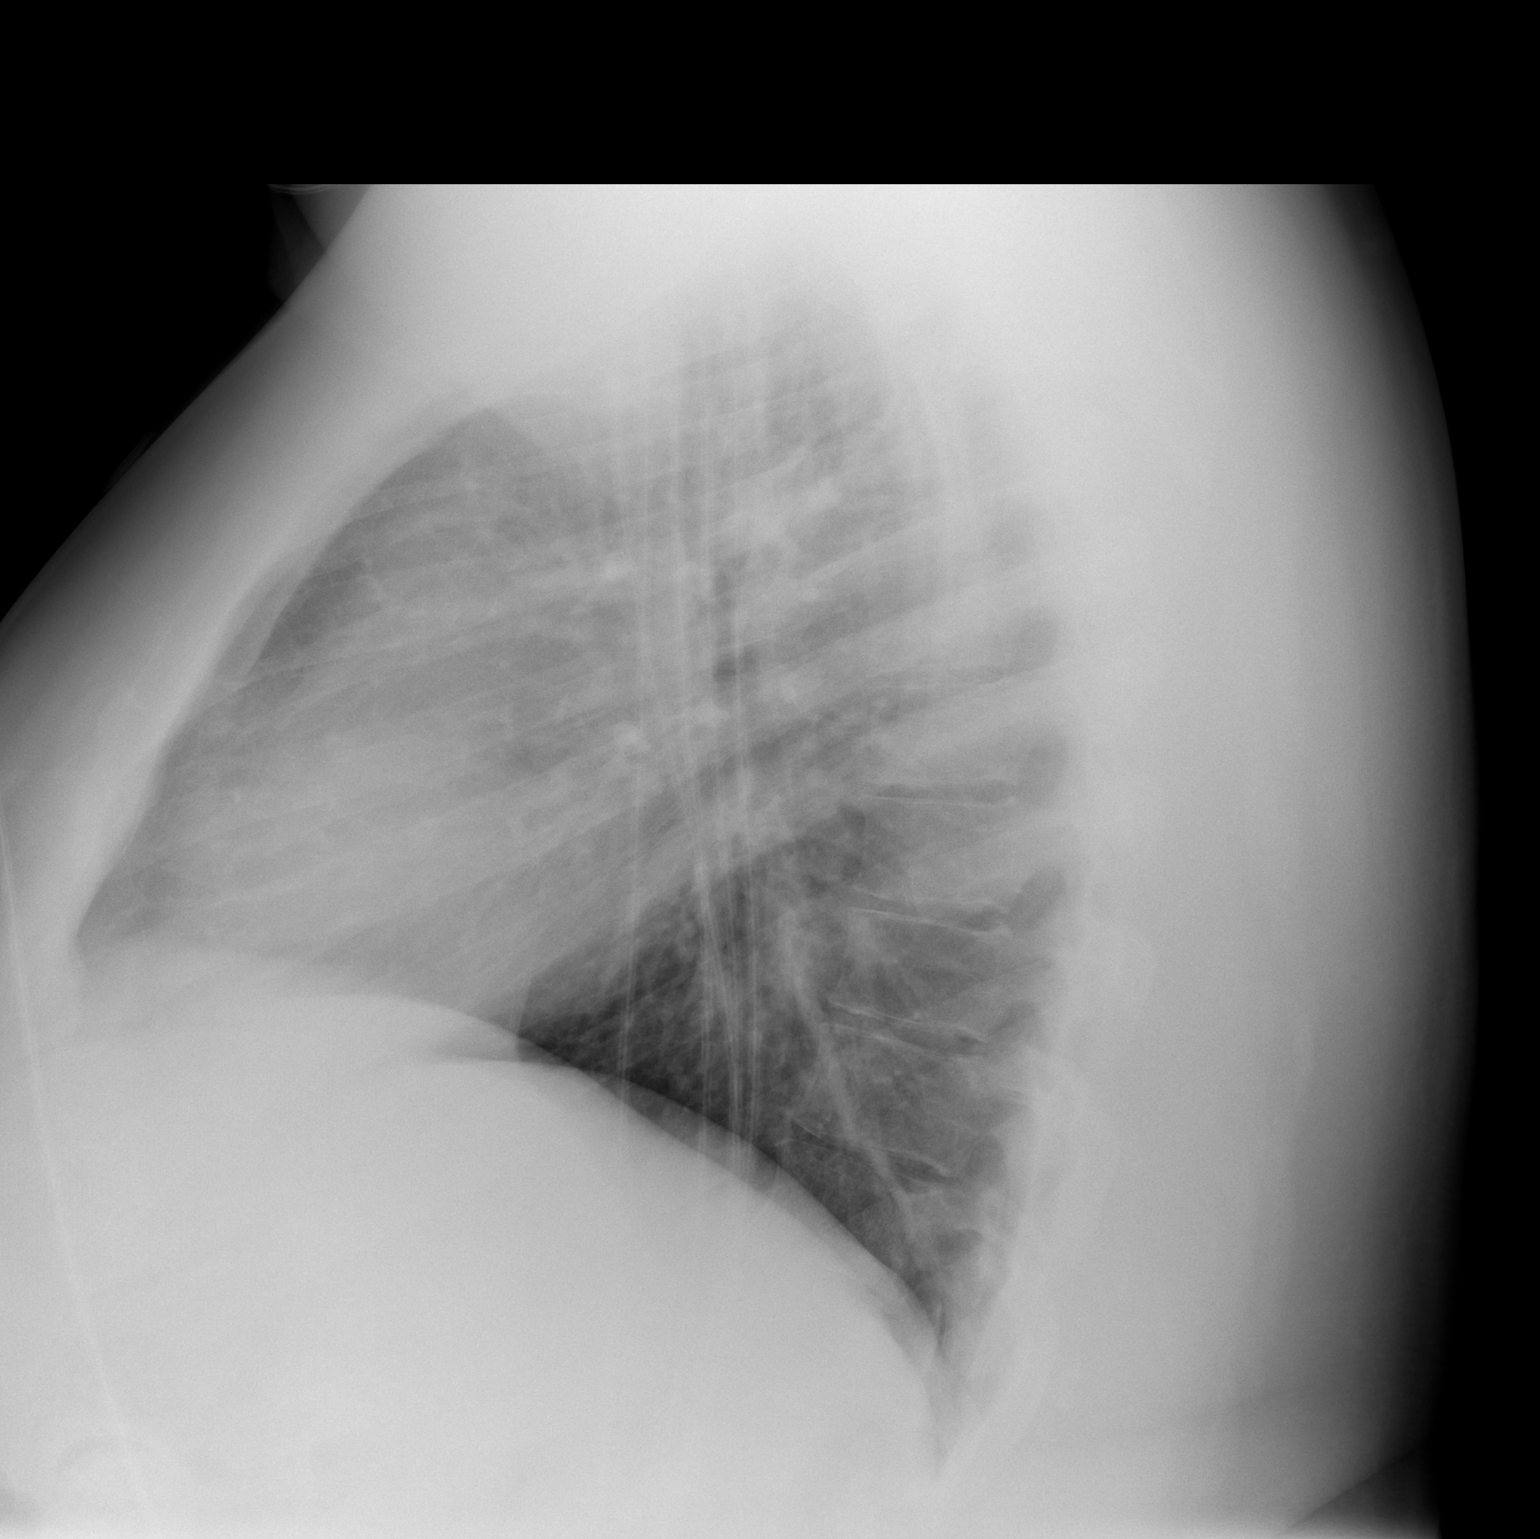

[2 of 2 positions shown; findings below may reference images not displayed]

FINDINGS: Monitoring leads overlie the patient. Normal cardiac and mediastinal
contours. No consolidative pulmonary opacities. No pleural effusion
or pneumothorax.
IMPRESSION: No acute cardiopulmonary process.

## 2018-11-07 ENCOUNTER — Encounter: Payer: Self-pay | Admitting: Medical

## 2018-11-07 ENCOUNTER — Ambulatory Visit: Payer: 59 | Admitting: Medical

## 2018-11-07 ENCOUNTER — Other Ambulatory Visit: Payer: Self-pay | Admitting: Medical

## 2018-11-07 VITALS — BP 132/83 | HR 90 | Temp 98.6°F | Resp 16 | Ht 76.0 in | Wt >= 6400 oz

## 2018-11-07 DIAGNOSIS — M79604 Pain in right leg: Secondary | ICD-10-CM

## 2018-11-07 DIAGNOSIS — M79609 Pain in unspecified limb: Secondary | ICD-10-CM | POA: Diagnosis not present

## 2018-11-07 DIAGNOSIS — R252 Cramp and spasm: Secondary | ICD-10-CM | POA: Diagnosis not present

## 2018-11-07 DIAGNOSIS — M791 Myalgia, unspecified site: Secondary | ICD-10-CM | POA: Diagnosis not present

## 2018-11-07 DIAGNOSIS — M79605 Pain in left leg: Secondary | ICD-10-CM

## 2018-11-07 MED ORDER — PREDNISONE 10 MG PO TABS: ORAL_TABLET | ORAL | 0 refills | Status: DC

## 2018-11-07 NOTE — Progress Notes (Signed)
   Subjective:    Patient ID: David Zuniga, male    DOB: 06/14/1986, 32 y.o.   MRN: [id number]  HPI  Pt in for follow up.  Pt states his lower calfs have been hurting over past 2 weeks.   Pt has pain rt lower hamstring and medial to popliteal area rt side. In the past when  Pt had similar pain I did lab work and gave nsaids initiall(but did not get better until used prednisone). Pt had some slight sed rate elevation in the past but negative [place]. Other inflammatory studies were negative.   Also recently states pain more in medial calfs. No swelling of calfs. No sob or wheezing.  In the past he would notice some pain after exercise in the past. He states throbbing pain at times after exercise.(most recently walking a mile before he noted pain)  In the past (when he had similar symptoms) prednisone stopped symptoms.  Pt got better with prednisone before he saw sports medicine in the past.  He does not have any weakness with achiness.     Review of Systems  Constitutional: Negative for chills, fatigue and fever.  Respiratory: Negative for cough, chest tightness, shortness of breath and wheezing.   Cardiovascular: Negative for chest pain and palpitations.  Gastrointestinal: Negative for abdominal distention, abdominal pain and anal bleeding.  Musculoskeletal: Positive for myalgias.       Lower ext.  Skin: Negative for rash.  Hematological: Negative for adenopathy. Does not bruise/bleed easily.       Objective:   Physical Exam   General- No acute distress. Pleasant patient. Neck- Full range of motion, no jvd Lungs- Clear, even and unlabored. Heart- regular rate and rhythm. Neurologic- CNII- XII grossly intact. bilateral lower ext- faint tender to palpation over medial calfs. No warmth. No induration.Negative homan sign bilaterally.   R leg- mild tenderness to palpation tendon medial to popliteal fossae.     Assessment & Plan:  For your pain in both lower extremities/medial  calf regions, I did place future labs to get tomorrow morning.  These labs are listed.  Among these are d-dimer test.  If that test will be positive then would need to get lower extremity ultrasound.  Your sed rate has been elevated in the past with similar presentations and providing taper dose of prednisone to use over the next 6 days but start prednisone dosing after you get the blood work done.  We will update you on lab results and decide if we need to refer you to sports medicine again or rheumatologist.  This time considering in our building might be beneficial to evaluate tendon above  calf area that seems to be more tender recently.  Date to be determined after lab review.  Mackie Pai, PA-C

## 2018-11-07 NOTE — Patient Instructions (Addendum)
For your pain in both lower extremities/medial calf regions, I did place future labs to get tomorrow morning.  These labs are listed.  Among these are d-dimer test.  If that test will be positive then would need to get lower extremity ultrasound.  Your sed rate has been elevated in the past with similar presentations and providing taper dose of prednisone to use over the next 6 days but start prednisone dosing after you get the blood work done.  We will update you on lab results and decide if we need to refer you to sports medicine again or rheumatologist.  This time considering sports med  in our building might be beneficial to evaluate tendon above  calf area that seems to be more tender recently.  Date to be determined after lab review.

## 2018-11-08 ENCOUNTER — Other Ambulatory Visit (INDEPENDENT_AMBULATORY_CARE_PROVIDER_SITE_OTHER): Payer: 59

## 2018-11-08 ENCOUNTER — Telehealth: Payer: Self-pay | Admitting: Medical

## 2018-11-08 DIAGNOSIS — M79604 Pain in right leg: Secondary | ICD-10-CM

## 2018-11-08 DIAGNOSIS — M79609 Pain in unspecified limb: Secondary | ICD-10-CM

## 2018-11-08 DIAGNOSIS — R252 Cramp and spasm: Secondary | ICD-10-CM

## 2018-11-08 DIAGNOSIS — M79605 Pain in left leg: Secondary | ICD-10-CM | POA: Diagnosis not present

## 2018-11-08 DIAGNOSIS — M791 Myalgia, unspecified site: Secondary | ICD-10-CM

## 2018-11-08 DIAGNOSIS — R972 Elevated prostate specific antigen [PSA]: Secondary | ICD-10-CM

## 2018-11-08 LAB — COMPREHENSIVE METABOLIC PANEL
ALT: 47 U/L (ref 0–53)
AST: 22 U/L (ref 0–37)
Albumin: 4.1 g/dL (ref 3.5–5.2)
Alkaline Phosphatase: 76 U/L (ref 39–117)
BUN: 13 mg/dL (ref 6–23)
CHLORIDE: 105 meq/L (ref 96–112)
CO2: 30 mEq/L (ref 19–32)
Calcium: 9.2 mg/dL (ref 8.4–10.5)
Creatinine, Ser: 0.99 mg/dL (ref 0.40–1.50)
GFR: 93.65 mL/min (ref >60.00)
Glucose, Bld: 106 mg/dL — ABNORMAL HIGH (ref 70–99)
POTASSIUM: 4 meq/L (ref 3.5–5.1)
Sodium: 140 mEq/L (ref 135–145)
Total Bilirubin: 0.5 mg/dL (ref 0.2–1.2)
Total Protein: 6.6 g/dL (ref 6.0–8.3)

## 2018-11-08 LAB — MAGNESIUM: MAGNESIUM: 1.9 mg/dL (ref 1.5–2.5)

## 2018-11-08 LAB — D-DIMER, QUANTITATIVE (NOT AT ARMC)

## 2018-11-08 LAB — CK: Total CK: 131 U/L (ref 7–232)

## 2018-11-08 LAB — SEDIMENTATION RATE: Sed Rate: 12 mm/hr (ref 0–15)

## 2018-11-08 NOTE — Telephone Encounter (Signed)
Referral to urologist placed. 

## 2018-11-09 LAB — ALDOLASE: Aldolase: 4.8 U/L (ref <=8.1)

## 2018-11-09 LAB — LACTATE DEHYDROGENASE: LDH: 128 U/L (ref 100–220)

## 2018-11-12 ENCOUNTER — Telehealth: Payer: Self-pay | Admitting: *Deleted

## 2018-11-12 NOTE — Telephone Encounter (Signed)
Received Lab Report results from [facility]; forwarded to provider/SLS 12/16

## 2018-11-13 ENCOUNTER — Encounter: Payer: Self-pay | Admitting: Medical

## 2018-11-14 ENCOUNTER — Telehealth: Payer: Self-pay

## 2018-11-14 NOTE — Telephone Encounter (Signed)
Copied from [place] [id number]. Topic: General - Other >> Nov 14, 2018  4:15 PM Virl Axe D wrote: Reason for CRM: pt stated he received two calls from [facility] about a referral however he does not know why he was referred there. Would like call back to speak with someone about this. Please advise CB#[phone]

## 2018-11-15 NOTE — Telephone Encounter (Signed)
Please advise 

## 2018-11-15 NOTE — Telephone Encounter (Signed)
Accidentally referred wrong pt to urologist. Notified pt on error. Can you cancel that referral or can Gwen?

## 2018-12-19 ENCOUNTER — Ambulatory Visit: Payer: 59 | Admitting: Medical

## 2018-12-19 ENCOUNTER — Ambulatory Visit (HOSPITAL_BASED_OUTPATIENT_CLINIC_OR_DEPARTMENT_OTHER)
Admission: RE | Admit: 2018-12-19 | Discharge: 2018-12-19 | Disposition: A | Discharge status: Home / Self Care | Payer: 59 | Source: Ambulatory Visit | Attending: Medical | Admitting: Medical

## 2018-12-19 ENCOUNTER — Encounter: Payer: Self-pay | Admitting: Medical

## 2018-12-19 VITALS — BP 130/90 | HR 91 | Temp 98.7°F | Resp 16 | Ht 76.0 in | Wt 396.4 lb

## 2018-12-19 DIAGNOSIS — M542 Cervicalgia: Secondary | ICD-10-CM | POA: Insufficient documentation

## 2018-12-19 DIAGNOSIS — R062 Wheezing: Secondary | ICD-10-CM

## 2018-12-19 DIAGNOSIS — R197 Diarrhea, unspecified: Secondary | ICD-10-CM

## 2018-12-19 DIAGNOSIS — J069 Acute upper respiratory infection, unspecified: Secondary | ICD-10-CM | POA: Diagnosis not present

## 2018-12-19 MED ORDER — BENZONATATE 100 MG PO CAPS: 100.0000 mg | ORAL_CAPSULE | Freq: Three times a day (TID) | ORAL | 0 refills | Status: DC | PRN

## 2018-12-19 MED ORDER — ALBUTEROL SULFATE HFA 108 (90 BASE) MCG/ACT IN AERS: 2.0000 | INHALATION_SPRAY | Freq: Four times a day (QID) | RESPIRATORY_TRACT | 2 refills | Status: DC | PRN

## 2018-12-19 MED ORDER — FLUTICASONE PROPIONATE 50 MCG/ACT NA SUSP: 2.0000 | Freq: Every day | NASAL | 1 refills | Status: DC

## 2018-12-19 MED ORDER — AZITHROMYCIN 250 MG PO TABS: ORAL_TABLET | ORAL | 0 refills | Status: DC

## 2018-12-19 NOTE — Patient Instructions (Signed)
You have some upper respiratory infectious type signs symptoms presently.  No sinus pressure presently and lungs do sound clear.  I am going to prescribe Flonase nasal spray for congestion and benzonatate for cough.  I did give you a print prescription of a azithromycin to use only in the event of worsening signs and symptoms as discussed.  For indications such as a sinusitis, bronchitis or possible pneumonia.  For neck pain that has been intermittent, do think is a good idea to go ahead and get x-ray of your cervical spine as you did have some radicular symptoms early on.  Pain is mild intermittent presently.  So would just recommend using Aleve as that has helped him.  Early on you had some wheezing so also making print prescription of albuterol available to use if needed.  You have some loose stools present yesterday.  Recommend bland diet and hydrate.  With her recent trip will need to follow-up this pattern.  If this not improving gradually/getting worse then would recommend notify me and I would put in order for you to do [place] stool panel studies.  Follow-up in 7 to 10 days or as needed.

## 2018-12-19 NOTE — Progress Notes (Signed)
Subjective:    Patient ID: David Zuniga, male    DOB: Sep 02, 1986, 33 y.o.   MRN: [id number]  HPI  Pt in for evaluation. He states just got from [place]. He states he was there on Friday afternoon. He got nasal, chest, congestion and cough. He had faint body aches on Friday and Saturday. No body aches now.  Pt states over weekend. Was coughing up mucus and blowing out mucus from nose. Pt thinks had fever on Saturday.   Pt also states since christmas he has had some neck pain. Pain was worse. Initially he tried alleve and helped some. Also tried heat pad. But since then pain comes and goes. Early on pain radiated down to rt arm during first 2 weeks. Last time had neck pain was 2 days ago.    Review of Systems  Constitutional: Positive for fatigue. Negative for chills and fever.       Early on.  HENT: Positive for congestion and sinus pressure. Negative for facial swelling, hearing loss, mouth sores, nosebleeds, postnasal drip, rhinorrhea, sinus pain and sore throat.   Respiratory: Positive for cough and wheezing. Negative for chest tightness and shortness of breath.        2 days ago had some mild wheeze on tour group.  Cardiovascular: Negative for chest pain and palpitations.  Gastrointestinal: Negative for abdominal pain and blood in stool.       Just one day of diarrhea yesterday. Went 4-5 times.  Musculoskeletal: Negative for back pain, joint swelling and neck stiffness.  Skin: Negative for rash.  Neurological: Negative for dizziness, syncope, weakness, numbness and headaches.  Hematological: Negative for adenopathy. Does not bruise/bleed easily.  Psychiatric/Behavioral: Negative for behavioral problems and confusion.    Past Medical History:  Diagnosis Date  . Hypertension      Social History   Socioeconomic History  . Marital status: Single    Spouse name: Not on file  . Number of children: Not on file  . Years of education: Not on file  . Highest education level: Not on  file  Occupational History  . Not on file  Social Needs  . Financial resource strain: Not on file  . Food insecurity:    Worry: Not on file    Inability: Not on file  . Transportation needs:    Medical: Not on file    Non-medical: Not on file  Tobacco Use  . Smoking status: Never Smoker  . Smokeless tobacco: Never Used  Substance and Sexual Activity  . Alcohol use: Yes    Alcohol/week: 0.0 standard drinks    Comment: occ  . Drug use: No  . Sexual activity: Not on file  Lifestyle  . Physical activity:    Days per week: Not on file    Minutes per session: Not on file  . Stress: Not on file  Relationships  . Social connections:    Talks on phone: Not on file    Gets together: Not on file    Attends religious service: Not on file    Active member of club or organization: Not on file    Attends meetings of clubs or organizations: Not on file    Relationship status: Not on file  . Intimate partner violence:    Fear of current or ex partner: Not on file    Emotionally abused: Not on file    Physically abused: Not on file    Forced sexual activity: Not on file  Other Topics  Concern  . Not on file  Social History Narrative  . Not on file    No past surgical history on file.  Family History  Problem Relation Age of Onset  . Hypertension Mother   . Bipolar disorder Mother        manic depressive  . Hypertension Father   . Cancer Maternal Grandfather 64       lung cancer  . Alcohol abuse Paternal Grandfather   . Heart disease Paternal Grandfather   . Osteoporosis Maternal Grandmother     No Known Allergies  Current Outpatient Medications on File Prior to Visit  Medication Sig Dispense Refill  . cholecalciferol (VITAMIN D) 1000 units tablet Take 1,000 Units by mouth daily.    . Multiple Vitamins-Minerals (MULTIVITAL) CHEW Chew 1 each by mouth daily.     Marland Kitchen olmesartan-hydrochlorothiazide (BENICAR HCT) 40-25 MG tablet TAKE 1 TABLET BY MOUTH EVERY DAY 30 tablet 3  .  omeprazole (PRILOSEC) 10 MG capsule Take 1 capsule by mouth daily.     No current facility-administered medications on file prior to visit.     BP 130/90 (BP Location: Right Arm, Patient Position: Sitting, Cuff Size: Large)   Pulse 91   Temp 98.7 F (37.1 C) (Oral)   Resp 16   Ht 6\' 4"  (1.93 m)   Wt (!) 396 lb 6.4 oz (179.8 kg)   SpO2 98%   BMI 48.25 kg/m       Objective:   Physical Exam  General  Mental Status - Alert. General Appearance - Well groomed. Not in acute distress.  Skin Rashes- No Rashes.  HEENT Head- Normal. Ear Auditory Canal - Left- Normal. Right - Normal.Tympanic Membrane- Left- Normal. Right- Normal. Eye Sclera/Conjunctiva- Left- Normal. Right- Normal. Nose & Sinuses Nasal Mucosa- Left-  Boggy and Congested. Right-  Boggy and  Congested.Bilateral maxillary and frontal sinus pressure. Mouth & Throat Lips: Upper Lip- Normal: no dryness, cracking, pallor, cyanosis, or vesicular eruption. Lower Lip-Normal: no dryness, cracking, pallor, cyanosis or vesicular eruption. Buccal Mucosa- Bilateral- No Aphthous ulcers. Oropharynx- No Discharge or Erythema. Tonsils: Characteristics- Bilateral- No Erythema or Congestion. Size/Enlargement- Bilateral- No enlargement. Discharge- bilateral-None.  Neck Neck- Supple. No Masses. No mid cervical  Spine pain. No trapezius tenderness to palpation.  Chest and Lung Exam Auscultation: Breath Sounds:-Clear even and unlabored.  Cardiovascular Auscultation:Rythm- Regular, rate and rhythm. Murmurs & Other Heart Sounds:Ausculatation of the heart reveal- No Murmurs.  Lymphatic Head & Neck General Head & Neck Lymphatics: Bilateral: Description- No Localized lymphadenopathy.  Neuro- CN III- XII grossly intact.  Abdomen- soft, nt, nd, +bs, no rebound or guarding.       Assessment & Plan:  You have some upper respiratory infectious type signs symptoms presently.  No sinus pressure presently and lungs do sound clear.  I  am going to prescribe Flonase nasal spray for congestion and benzonatate for cough.  I did give you a print prescription of a azithromycin to use only in the event of worsening signs and symptoms as discussed.  For indications such as a sinusitis, bronchitis or possible pneumonia.  For neck pain that has been intermittent, do think is a good idea to go ahead and get x-ray of your cervical spine as you did have some radicular symptoms early on.  Pain is mild intermittent presently.  So would just recommend using Aleve as that has helped him.  Early on you had some wheezing so also making print prescription of albuterol available to use if needed.  You have some loose stools present yesterday.  Recommend bland diet and hydrate.  With her recent trip will need to follow-up this pattern.  If this not improving gradually/getting worse then would recommend notify me and I would put in order for you to do [place] stool panel studies.  Follow-up in 7 to 10 days or as needed.  Mackie Pai, PA-C

## 2018-12-23 ENCOUNTER — Encounter: Payer: Self-pay | Admitting: Medical

## 2018-12-24 ENCOUNTER — Telehealth: Payer: Self-pay | Admitting: Medical

## 2018-12-24 DIAGNOSIS — R197 Diarrhea, unspecified: Secondary | ICD-10-CM

## 2018-12-24 NOTE — Telephone Encounter (Signed)
Future labs placed. 

## 2018-12-25 ENCOUNTER — Other Ambulatory Visit (INDEPENDENT_AMBULATORY_CARE_PROVIDER_SITE_OTHER): Payer: 59

## 2018-12-25 DIAGNOSIS — R197 Diarrhea, unspecified: Secondary | ICD-10-CM | POA: Diagnosis not present

## 2018-12-26 ENCOUNTER — Other Ambulatory Visit: Payer: 59

## 2018-12-26 DIAGNOSIS — R197 Diarrhea, unspecified: Secondary | ICD-10-CM | POA: Diagnosis not present

## 2018-12-26 LAB — CBC WITH DIFFERENTIAL/PLATELET
Basophils Absolute: 0 10*3/uL (ref 0.0–0.1)
Basophils Relative: 0.4 % (ref 0.0–3.0)
Eosinophils Absolute: 0.2 10*3/uL (ref 0.0–0.7)
Eosinophils Relative: 1.6 % (ref 0.0–5.0)
HCT: 40.9 % (ref 39.0–52.0)
Hemoglobin: 14.1 g/dL (ref 13.0–17.0)
LYMPHS ABS: 3.3 10*3/uL (ref 0.7–4.0)
Lymphocytes Relative: 27 % (ref 12.0–46.0)
MCHC: 34.4 g/dL (ref 30.0–36.0)
MCV: 87.3 fl (ref 78.0–100.0)
Monocytes Absolute: 1.6 10*3/uL — ABNORMAL HIGH (ref 0.1–1.0)
Monocytes Relative: 13.4 % — ABNORMAL HIGH (ref 3.0–12.0)
Neutro Abs: 6.9 10*3/uL (ref 1.4–7.7)
Neutrophils Relative %: 57.6 % (ref 43.0–77.0)
Platelets: 439 10*3/uL — ABNORMAL HIGH (ref 150.0–400.0)
RBC: 4.69 Mil/uL (ref 4.22–5.81)
RDW: 12.7 % (ref 11.5–15.5)
WBC: 12 10*3/uL — ABNORMAL HIGH (ref 4.0–10.5)

## 2018-12-26 LAB — COMPREHENSIVE METABOLIC PANEL
ALT: 72 U/L — ABNORMAL HIGH (ref 0–53)
AST: 35 U/L (ref 0–37)
Albumin: 4.2 g/dL (ref 3.5–5.2)
Alkaline Phosphatase: 70 U/L (ref 39–117)
BUN: 11 mg/dL (ref 6–23)
CO2: 29 meq/L (ref 19–32)
Calcium: 9.3 mg/dL (ref 8.4–10.5)
Chloride: 99 mEq/L (ref 96–112)
Creatinine, Ser: 1.12 mg/dL (ref 0.40–1.50)
GFR: 76.35 mL/min (ref >60.00)
GLUCOSE: 77 mg/dL (ref 70–99)
Potassium: 3.9 mEq/L (ref 3.5–5.1)
Sodium: 137 mEq/L (ref 135–145)
Total Bilirubin: 0.7 mg/dL (ref 0.2–1.2)
Total Protein: 6.8 g/dL (ref 6.0–8.3)

## 2018-12-28 ENCOUNTER — Telehealth: Payer: Self-pay | Admitting: Medical

## 2018-12-28 LAB — GASTROINTESTINAL PATHOGEN PANEL PCR
C. difficile Tox A/B, PCR: NOT DETECTED
Campylobacter, PCR: NOT DETECTED
Cryptosporidium, PCR: NOT DETECTED
E COLI 0157, PCR: NOT DETECTED
E coli (ETEC) LT/ST PCR: DETECTED — AB
E coli (STEC) stx1/stx2, PCR: NOT DETECTED
Giardia lamblia, PCR: NOT DETECTED
Norovirus, PCR: NOT DETECTED
Rotavirus A, PCR: NOT DETECTED
Salmonella, PCR: DETECTED — AB
Shigella, PCR: NOT DETECTED

## 2018-12-28 MED ORDER — CIPROFLOXACIN HCL 500 MG PO TABS: 500.0000 mg | ORAL_TABLET | Freq: Two times a day (BID) | ORAL | 0 refills | Status: DC

## 2018-12-28 NOTE — Telephone Encounter (Signed)
Rx cipro antibiotic sent to pharmacy.

## 2018-12-28 NOTE — Telephone Encounter (Signed)
rx cipro sent to pharmacy

## 2019-03-19 ENCOUNTER — Other Ambulatory Visit: Payer: Self-pay | Admitting: Medical

## 2019-04-17 ENCOUNTER — Other Ambulatory Visit: Payer: Self-pay | Admitting: Medical

## 2019-05-13 ENCOUNTER — Other Ambulatory Visit: Payer: Self-pay | Admitting: Medical

## 2019-05-23 ENCOUNTER — Encounter: Payer: Self-pay | Admitting: Family Medicine

## 2019-05-23 ENCOUNTER — Other Ambulatory Visit: Payer: Self-pay

## 2019-05-23 ENCOUNTER — Ambulatory Visit (INDEPENDENT_AMBULATORY_CARE_PROVIDER_SITE_OTHER): Payer: 59 | Admitting: Family Medicine

## 2019-05-23 VITALS — Ht 77.0 in | Wt >= 6400 oz

## 2019-05-23 DIAGNOSIS — T6591XA Toxic effect of unspecified substance, accidental (unintentional), initial encounter: Secondary | ICD-10-CM | POA: Diagnosis not present

## 2019-05-23 MED ORDER — LORATADINE 10 MG PO TABS: 10.0000 mg | ORAL_TABLET | Freq: Every day | ORAL | 11 refills | Status: DC

## 2019-05-23 MED ORDER — PREDNISONE 10 MG PO TABS: ORAL_TABLET | ORAL | 0 refills | Status: DC

## 2019-05-23 NOTE — Progress Notes (Signed)
Virtual Visit via Video Note  I connected with David Zuniga on 05/23/19 at  3:45 PM EDT by a video enabled telemedicine application and verified that I am speaking with the correct person using two identifiers.  Location: Patient: work  Provider: office    I discussed the limitations of evaluation and management by telemedicine and the availability of in person appointments. The patient expressed understanding and agreed to proceed.  History of Present Illness: Pt c/o rash all over from neck down x last 2-3 weeks.  He changed body washes and is pretty sure that is what it is from but he changed back and the rash is still there .  No other concerns    Observations/Objective: No vitals obtained  Pt is in NAD  Assessment and Plan: 1. Allergic reaction to chemical substance, accidental or unintentional, initial encounter pred taper and antihistamine otc Call or rto prn  - predniSONE (DELTASONE) 10 MG tablet; TAKE 3 TABLETS PO QD FOR 3 DAYS THEN TAKE 2 TABLETS PO QD FOR 3 DAYS THEN TAKE 1 TABLET PO QD FOR 3 DAYS THEN TAKE 1/2 TAB PO QD FOR 3 DAYS  Dispense: 20 tablet; Refill: 0 - loratadine (CLARITIN) 10 MG tablet; Take 1 tablet (10 mg total) by mouth daily.  Dispense: 30 tablet; Refill: 11   Follow Up Instructions:    I discussed the assessment and treatment plan with the patient. The patient was provided an opportunity to ask questions and all were answered. The patient agreed with the plan and demonstrated an understanding of the instructions.   The patient was advised to call back or seek an in-person evaluation if the symptoms worsen or if the condition fails to improve as anticipated.  I provided 15 minutes of non-face-to-face time during this encounter.   Ann Held, DO

## 2019-06-05 ENCOUNTER — Encounter: Payer: Self-pay | Admitting: Family Medicine

## 2019-06-05 ENCOUNTER — Ambulatory Visit: Payer: 59 | Admitting: Family Medicine

## 2019-06-05 ENCOUNTER — Other Ambulatory Visit: Payer: Self-pay

## 2019-06-05 VITALS — BP 144/88 | HR 106 | Temp 98.9°F | Ht 77.0 in | Wt >= 6400 oz

## 2019-06-05 DIAGNOSIS — B86 Scabies: Secondary | ICD-10-CM | POA: Diagnosis not present

## 2019-06-05 DIAGNOSIS — D489 Neoplasm of uncertain behavior, unspecified: Secondary | ICD-10-CM

## 2019-06-05 MED ORDER — PERMETHRIN 5 % EX CREA: 1.0000 "application " | TOPICAL_CREAM | Freq: Once | CUTANEOUS | 1 refills | Status: DC

## 2019-06-05 NOTE — Patient Instructions (Signed)
Scabies, Adult  Scabies is a skin condition that happens when very small insects get under the skin (infestation). This causes a rash and severe itchiness. Scabies can spread from person to person (is contagious). If you get scabies, it is common for others in your household to get scabies too. With proper treatment, symptoms usually go away in 2-4 weeks. Scabies usually does not cause lasting problems. What are the causes? This condition is caused by tiny mites (Sarcoptes scabiei, or human itch mites) that can only be seen with a microscope. The mites get into the top layer of skin and lay eggs. Scabies can spread from person to person through:  Close contact with a person who has scabies.  Sharing or having contact with infested items, such as towels, bedding, or clothing. What increases the risk? The following factors may make you more likely to develop this condition:  Living in a nursing home or other extended care facility.  Having sexual contact with a partner who has scabies.  Caring for others who are at increased risk for scabies. What are the signs or symptoms? Symptoms of this condition include:  Severe itchiness. This is often worse at night.  A rash that includes tiny red bumps or blisters. The rash commonly occurs on the hands, wrists, elbows, armpits, chest, waist, groin, or buttocks. The bumps may form a line (burrow) in some areas.  Skin irritation. This can include scaly patches or sores. How is this diagnosed? This condition may be diagnosed based on:  A physical exam of the skin.  A skin test. Your health care provider may take a sample of your affected skin (skin scraping) and have it examined under a microscope for signs of mites. How is this treated? This condition may be treated with:  Medicated cream or lotion that kills the mites. This is spread on the entire body and left on for several hours. Usually, one treatment with medicated cream or lotion is  enough to kill all the mites. In severe cases, the treatment may need to be repeated.  Medicated cream that relieves itching.  Medicines taken by mouth (orally) that: ? Relieve itching. ? Reduce the swelling and redness. ? Kill the mites. This treatment may be done in severe cases. Follow these instructions at home: Medicines   Take or apply over-the-counter and prescription medicines as told by your health care provider.  Apply medicated cream or lotion as told by your health care provider.  Do not wash off the medicated cream or lotion until the necessary amount of time has passed. Skin care   Avoid scratching the affected areas of your skin.  Keep your fingernails closely trimmed to reduce injury from scratching.  Take cool baths or apply cool washcloths to your skin to help reduce itching. General instructions  Clean all items that you recently had contact with, including bedding, clothing, and furniture. Do this on the same day that you start treatment. ? Dry clean items, or use hot water to wash items. Dry items on the hot dry cycle. ? Place items that cannot be washed into closed, airtight plastic bags for at least 3 days. The mites cannot live for more than 3 days away from human skin. ? Vacuum furniture and mattresses that you use.  Make sure that other people who may have been infested are examined by a health care provider. These include members of your household and anyone who may have had contact with infested items.  Keep all follow-up  visits as told by your health care provider. This is important. Contact a health care provider if:  You have itching that does not go away after 4 weeks of treatment.  You continue to develop new bumps or burrows.  You have redness, swelling, or pain in your rash area after treatment.  You have fluid, blood, or pus coming from your rash. Summary  Scabies is a skin condition that causes a rash and severe itchiness.  This  condition is caused by tiny mites that get into the top layer of the skin and lay eggs.  Scabies can spread from person to person.  Follow treatments as recommended by your health care provider.  Clean all items that you recently had contact with. This information is not intended to replace advice given to you by your health care provider. Make sure you discuss any questions you have with your health care provider. Document Released: 08/05/2015 Document Revised: 09/19/2018 Document Reviewed: 09/19/2018 Elsevier Patient Education  2020 [employer].

## 2019-06-05 NOTE — Progress Notes (Signed)
Chief Complaint  Patient presents with  . Rash    all over    [facility] Z[facility] is a 33 y.o. male here for a skin complaint.   Duration: 4 weeks Location: UE's, LE's, torso Pruritic? Yes Painful? No Drainage? No New soaps/lotions/topicals/detergents? Yes- changed soaps back Sick contacts? No Other associated symptoms: none Therapies tried thus far: Pred, Claritin; neither helpful  ROS:  Const: No fevers Skin: As noted in HPI  Past Medical History:  Diagnosis Date  . Hypertension     BP (!) 144/88 (BP Location: Left Arm, Patient Position: Sitting, Cuff Size: Large)   Pulse (!) 106   Temp 98.9 F (37.2 C) (Oral)   Ht 6\' 5"  (1.956 m)   Wt (!) 412 lb 2 oz (186.9 kg)   SpO2 98%   BMI 48.87 kg/m  Gen: awake, alert, appearing stated age Lungs: No accessory muscle use Skin: excoriated lesions on UE's, interweb spaces on hands, dorsum of feet, abd, sparse on back; spares head/neck.  There is a pedunculated lesion over tailbone area.  Psych: Age appropriate judgment and insight  Scabies - Plan: permethrin (ELIMITE) 5 % cream, enough given to repeat in 2 weeks.   Neoplasm of uncertain behavior - Plan: Will have him return for shave biopsy at earliest convenience.   BP elevated upon recheck as well. Monitor at home. Will ck again when he returns to clinic.  The patient voiced understanding and agreement to the plan.  [place], DO 06/05/19 2:07 PM

## 2019-06-11 ENCOUNTER — Other Ambulatory Visit: Payer: Self-pay | Admitting: Medical

## 2019-06-12 ENCOUNTER — Encounter: Payer: Self-pay | Admitting: Family Medicine

## 2019-06-12 ENCOUNTER — Ambulatory Visit: Payer: 59 | Admitting: Family Medicine

## 2019-06-12 ENCOUNTER — Other Ambulatory Visit: Payer: Self-pay

## 2019-06-12 DIAGNOSIS — D489 Neoplasm of uncertain behavior, unspecified: Secondary | ICD-10-CM | POA: Diagnosis not present

## 2019-06-12 MED ORDER — TRIAMCINOLONE ACETONIDE 0.1 % EX CREA: 1.0000 "application " | TOPICAL_CREAM | Freq: Two times a day (BID) | CUTANEOUS | 0 refills | Status: DC

## 2019-06-12 NOTE — Progress Notes (Signed)
Chief Complaint  Patient presents with  . Procedure   Here for skin procedure. Large lesion on top of lower back/gluteal cleft. No pain or drainage.  Skin: 1.8 cm x 1.1 cm lesion on cephalad portion of intergluteal cleft. Some scaling/excoriation centrally. Hyperpigmented. Psych: Age approp judgment and insight.  Procedure note; shave biopsy Informed consent was obtained. The area was cleaned with alcohol and injected with 5 mL of 1% lidocaine with epinephrine. A Dermablade was slightly bent and used to cut under the area of interest. The specimen was placed in a sterile specimen cup and sent to the lab. The area was then cauterized ensuring adequate hemostasis. The area was dressed with triple antibiotic ointment and a bandage. There were no complications noted. The patient tolerated the procedure well.  Neoplasm of uncertain behavior - Plan: PR SHAV SKIN LES 1.1-2.0 CM TRUNK,ARM,LEG, there was a decent amount of adipose under lesion, very interested to see what biopsy shows.   Aftercare instructions verbalized and written down. Let me know if anything changes. F/u w me prn. Pt voiced understanding and agreement to the plan.  Crosby Oyster Kalen Neidert 2:14 PM 06/12/19

## 2019-06-12 NOTE — Patient Instructions (Addendum)
Do not shower for the rest of the day. When you do wash it, use only soap and water. Do not vigorously scrub. Apply triple antibiotic ointment (like Neosporin) twice daily. Keep the area clean and dry.   Things to look out for: increasing pain not relieved by ibuprofen/acetaminophen, fevers, spreading redness, drainage of pus, or foul odor.  Give [place] several days to get the results of your biopsy back.   Let us know if you need anything.

## 2019-06-25 ENCOUNTER — Other Ambulatory Visit: Payer: Self-pay | Admitting: Family Medicine

## 2019-06-25 DIAGNOSIS — B86 Scabies: Secondary | ICD-10-CM

## 2019-07-01 ENCOUNTER — Encounter: Payer: Self-pay | Admitting: Family Medicine

## 2019-07-01 MED ORDER — IVERMECTIN 3 MG PO TABS: ORAL_TABLET | ORAL | 0 refills | Status: DC

## 2019-07-08 ENCOUNTER — Other Ambulatory Visit: Payer: Self-pay | Admitting: Family Medicine

## 2019-07-23 ENCOUNTER — Other Ambulatory Visit: Payer: Self-pay | Admitting: Family Medicine

## 2019-07-23 ENCOUNTER — Other Ambulatory Visit: Payer: Self-pay | Admitting: Medical

## 2019-07-23 DIAGNOSIS — B86 Scabies: Secondary | ICD-10-CM

## 2019-07-24 ENCOUNTER — Encounter: Payer: Self-pay | Admitting: Medical

## 2019-07-24 NOTE — Telephone Encounter (Signed)
Rx elimite sent to pt pharmacy.

## 2019-08-01 ENCOUNTER — Other Ambulatory Visit: Payer: Self-pay | Admitting: Medical

## 2019-08-06 ENCOUNTER — Encounter: Payer: Self-pay | Admitting: Medical

## 2019-08-06 ENCOUNTER — Telehealth: Payer: Self-pay | Admitting: Medical

## 2019-08-06 DIAGNOSIS — R21 Rash and other nonspecific skin eruption: Secondary | ICD-10-CM

## 2019-08-06 NOTE — Telephone Encounter (Signed)
Referral to dermatologist placed. 

## 2019-09-10 ENCOUNTER — Other Ambulatory Visit: Payer: Self-pay | Admitting: Medical

## 2019-09-24 ENCOUNTER — Other Ambulatory Visit: Payer: Self-pay | Admitting: *Deleted

## 2019-09-24 MED ORDER — FLUTICASONE PROPIONATE 50 MCG/ACT NA SUSP: 2.0000 | Freq: Every day | NASAL | 1 refills | Status: AC

## 2019-10-08 ENCOUNTER — Other Ambulatory Visit: Payer: Self-pay | Admitting: Family Medicine

## 2019-11-09 ENCOUNTER — Other Ambulatory Visit: Payer: Self-pay | Admitting: Family Medicine

## 2019-11-17 ENCOUNTER — Other Ambulatory Visit: Payer: Self-pay

## 2019-11-17 ENCOUNTER — Encounter (HOSPITAL_BASED_OUTPATIENT_CLINIC_OR_DEPARTMENT_OTHER): Payer: Self-pay

## 2019-11-17 ENCOUNTER — Emergency Department (HOSPITAL_BASED_OUTPATIENT_CLINIC_OR_DEPARTMENT_OTHER)
Admission: EM | Admit: 2019-11-17 | Discharge: 2019-11-17 | Disposition: A | Discharge status: Home / Self Care | Payer: 59 | Attending: Emergency Medicine | Admitting: Emergency Medicine

## 2019-11-17 ENCOUNTER — Emergency Department (HOSPITAL_BASED_OUTPATIENT_CLINIC_OR_DEPARTMENT_OTHER): Payer: 59

## 2019-11-17 DIAGNOSIS — R630 Anorexia: Secondary | ICD-10-CM | POA: Diagnosis not present

## 2019-11-17 DIAGNOSIS — R1031 Right lower quadrant pain: Secondary | ICD-10-CM | POA: Insufficient documentation

## 2019-11-17 DIAGNOSIS — I10 Essential (primary) hypertension: Secondary | ICD-10-CM | POA: Diagnosis not present

## 2019-11-17 DIAGNOSIS — Z79899 Other long term (current) drug therapy: Secondary | ICD-10-CM | POA: Insufficient documentation

## 2019-11-17 DIAGNOSIS — R109 Unspecified abdominal pain: Secondary | ICD-10-CM | POA: Diagnosis present

## 2019-11-17 LAB — CBC WITH DIFFERENTIAL/PLATELET
Abs Immature Granulocytes: 0.04 10*3/uL (ref 0.00–0.07)
Basophils Absolute: 0 10*3/uL (ref 0.0–0.1)
Basophils Relative: 0 %
Eosinophils Absolute: 0.3 10*3/uL (ref 0.0–0.5)
Eosinophils Relative: 2 %
HCT: 40.6 % (ref 39.0–52.0)
Hemoglobin: 14 g/dL (ref 13.0–17.0)
Immature Granulocytes: 0 %
Lymphocytes Relative: 31 %
Lymphs Abs: 4 10*3/uL (ref 0.7–4.0)
MCH: 30.3 pg (ref 26.0–34.0)
MCHC: 34.5 g/dL (ref 30.0–36.0)
MCV: 87.9 fL (ref 80.0–100.0)
Monocytes Absolute: 1.1 10*3/uL — ABNORMAL HIGH (ref 0.1–1.0)
Monocytes Relative: 8 %
Neutro Abs: 7.5 10*3/uL (ref 1.7–7.7)
Neutrophils Relative %: 59 %
Platelets: 350 10*3/uL (ref 150–400)
RBC: 4.62 MIL/uL (ref 4.22–5.81)
RDW: 12.6 % (ref 11.5–15.5)
WBC: 12.9 10*3/uL — ABNORMAL HIGH (ref 4.0–10.5)
nRBC: 0 % (ref 0.0–0.2)

## 2019-11-17 LAB — LIPASE, BLOOD: Lipase: 21 U/L (ref 11–51)

## 2019-11-17 LAB — COMPREHENSIVE METABOLIC PANEL
ALT: 55 U/L — ABNORMAL HIGH (ref 0–44)
AST: 30 U/L (ref 15–41)
Albumin: 3.8 g/dL (ref 3.5–5.0)
Alkaline Phosphatase: 67 U/L (ref 38–126)
Anion gap: 8 (ref 5–15)
BUN: 15 mg/dL (ref 6–20)
CO2: 27 mmol/L (ref 22–32)
Calcium: 8.9 mg/dL (ref 8.9–10.3)
Chloride: 103 mmol/L (ref 98–111)
Creatinine, Ser: 0.98 mg/dL (ref 0.61–1.24)
GFR calc Af Amer: >60 mL/min (ref >60)
GFR calc non Af Amer: >60 mL/min (ref >60)
Glucose, Bld: 91 mg/dL (ref 70–99)
Potassium: 3.5 mmol/L (ref 3.5–5.1)
Sodium: 138 mmol/L (ref 135–145)
Total Bilirubin: 0.4 mg/dL (ref 0.3–1.2)
Total Protein: 7.1 g/dL (ref 6.5–8.1)

## 2019-11-17 LAB — URINALYSIS, ROUTINE W REFLEX MICROSCOPIC
Bilirubin Urine: NEGATIVE
Glucose, UA: NEGATIVE mg/dL
Hgb urine dipstick: NEGATIVE
Ketones, ur: NEGATIVE mg/dL
Leukocytes,Ua: NEGATIVE
Nitrite: NEGATIVE
Protein, ur: NEGATIVE mg/dL
Specific Gravity, Urine: 1.01 (ref 1.005–1.030)
pH: 6.5 (ref 5.0–8.0)

## 2019-11-17 MED ORDER — IOHEXOL 300 MG/ML  SOLN
100.0000 mL | Freq: Once | INTRAMUSCULAR | Status: AC | PRN
  Administered 2019-11-17: 100 mL via INTRAVENOUS

## 2019-11-17 MED ORDER — POLYETHYLENE GLYCOL 3350 17 G PO PACK: 17.0000 g | PACK | Freq: Every day | ORAL | 0 refills | Status: AC

## 2019-11-17 NOTE — ED Triage Notes (Signed)
Pt reports diffuse abdominal pain for past 3 days, denies n/v/d, states different areas of abdomen hurt at different times.  States felt unusually full after eating small meal.

## 2019-11-17 NOTE — Discharge Instructions (Signed)
Take MiraLAX once daily until you have producing more regular, soft bowel movements.  Please follow-up with your doctor or gastroenterologist if your symptoms are continuing.  Please return to the emergency department if you develop any new or worsening symptoms.

## 2019-11-17 NOTE — ED Provider Notes (Signed)
[facility] Provider Note   CSN: [id number] Arrival date & time: 11/17/19  1805     History Chief Complaint  Patient presents with  . Abdominal Pain    David Zuniga is a 33 y.o. male  with history of hypertension who presents with a 3-day history of abdominal pain, worse on the right side.  He denies any nausea, vomiting, diarrhea.  He has had decreased appetite and early satiety.  He denies any fever, chest pain, shortness of breath, cough, back pain, urinary symptoms, swelling to scrotum.  He took ibuprofen at home without relief.  HPI     Past Medical History:  Diagnosis Date  . Hypertension     Patient Active Problem List   Diagnosis Date Noted  . Overweight 03/26/2018  . Vitamin D deficiency 03/22/2018  . Lesion of skin of left ear 02/26/2018  . Chest pain 08/15/2017  . HTN (hypertension) 01/15/2016    No past surgical history on file.     Family History  Problem Relation Age of Onset  . Hypertension Mother   . Bipolar disorder Mother        manic depressive  . Hypertension Father   . Cancer Maternal Grandfather 41       lung cancer  . Alcohol abuse Paternal Grandfather   . Heart disease Paternal Grandfather   . Osteoporosis Maternal Grandmother     Social History   Tobacco Use  . Smoking status: Never Smoker  . Smokeless tobacco: Never Used  Substance Use Topics  . Alcohol use: Yes    Alcohol/week: 0.0 standard drinks    Comment: occ  . Drug use: No    Home Medications Prior to Admission medications   Medication Sig Start Date End Date Taking? Authorizing Provider  cholecalciferol (VITAMIN D) 1000 units tablet Take 1,000 Units by mouth daily.    [provider]  fluticasone (FLONASE) 50 MCG/ACT nasal spray Place 2 sprays into both nostrils daily. 09/24/19   Saguier, Percell Miller, PA-C  ivermectin (STROMECTOL) 3 MG TABS tablet Take 12.5 tabs once. Repeat in 2 weeks. 07/01/19   Shelda Pal, DO    loratadine (CLARITIN) 10 MG tablet Take 1 tablet (10 mg total) by mouth daily. 05/23/19   Ann Held, DO  olmesartan-hydrochlorothiazide (BENICAR HCT) 40-25 MG tablet TAKE 1 TABLET BY MOUTH EVERY DAY 11/11/19   Saguier, Percell Miller, PA-C  permethrin (ELIMITE) 5 % cream APPLY TOPICALLY ONCE AS DIRECTED 07/24/19   Saguier, Percell Miller, PA-C  polyethylene glycol (MIRALAX) 17 g packet Take 17 g by mouth daily. 11/17/19   Trenita Hulme, Bea Graff, PA-C  triamcinolone cream (KENALOG) 0.1 % APPLY TO AFFECTED AREA TWICE A DAY 07/08/19   Shelda Pal, DO    Allergies    Patient has no known allergies.  Review of Systems   Review of Systems  Constitutional: Positive for appetite change. Negative for chills and fever.  HENT: Negative for facial swelling and sore throat.   Respiratory: Negative for shortness of breath.   Cardiovascular: Negative for chest pain.  Gastrointestinal: Positive for abdominal pain. Negative for nausea and vomiting.  Genitourinary: Negative for dysuria, flank pain, penile pain, penile swelling, scrotal swelling and testicular pain.  Musculoskeletal: Negative for back pain.  Skin: Negative for rash and wound.  Neurological: Negative for headaches.  Psychiatric/Behavioral: The patient is not nervous/anxious.     Physical Exam Updated Vital Signs BP 123/88   Pulse 75   Temp 98.5 F (36.9 C) (  Oral)   Resp 18   Ht 6\' 5"  (1.956 m)   Wt (!) 189.1 kg   SpO2 98%   BMI 49.45 kg/m   Physical Exam Vitals and nursing note reviewed.  Constitutional:      General: He is not in acute distress.    Appearance: He is well-developed. He is not diaphoretic.  HENT:     Head: Normocephalic and atraumatic.     Mouth/Throat:     Pharynx: No oropharyngeal exudate.  Eyes:     General: No scleral icterus.       Right eye: No discharge.        Left eye: No discharge.     Conjunctiva/sclera: Conjunctivae normal.     Pupils: Pupils are equal, round, and reactive to light.  Neck:      Thyroid: No thyromegaly.  Cardiovascular:     Rate and Rhythm: Normal rate and regular rhythm.     Heart sounds: Normal heart sounds. No murmur. No friction rub. No gallop.   Pulmonary:     Effort: Pulmonary effort is normal. No respiratory distress.     Breath sounds: Normal breath sounds. No stridor. No wheezing or rales.  Abdominal:     General: Bowel sounds are normal. There is no distension.     Palpations: Abdomen is soft.     Tenderness: There is abdominal tenderness in the right lower quadrant. There is no right CVA tenderness, left CVA tenderness, guarding or rebound. Positive signs include McBurney's sign. Negative signs include Murphy's sign.  Musculoskeletal:     Cervical back: Normal range of motion and neck supple.  Lymphadenopathy:     Cervical: No cervical adenopathy.  Skin:    General: Skin is warm and dry.     Coloration: Skin is not pale.     Findings: No rash.  Neurological:     Mental Status: He is alert.     Coordination: Coordination normal.     ED Results / Procedures / Treatments   Labs (all labs ordered are listed, but only abnormal results are displayed) Labs Reviewed  COMPREHENSIVE METABOLIC PANEL - Abnormal; Notable for the following components:      Result Value   ALT 55 (*)    All other components within normal limits  CBC WITH DIFFERENTIAL/PLATELET - Abnormal; Notable for the following components:   WBC 12.9 (*)    Monocytes Absolute 1.1 (*)    All other components within normal limits  LIPASE, BLOOD  URINALYSIS, ROUTINE W REFLEX MICROSCOPIC    EKG None  Radiology CT ABDOMEN PELVIS W CONTRAST  Result Date: 11/17/2019 CLINICAL DATA:  Diffuse abdominal pain for 3 days. EXAM: CT ABDOMEN AND PELVIS WITH CONTRAST TECHNIQUE: Multidetector CT imaging of the abdomen and pelvis was performed using the standard protocol following bolus administration of intravenous contrast. CONTRAST:  16mL OMNIPAQUE IOHEXOL 300 MG/ML  SOLN COMPARISON:   None. FINDINGS: Lower Chest: No acute findings. Hepatobiliary:  No hepatic masses identified. Pancreas: No mass or inflammatory changes. Mild-to-moderate diffuse hepatic steatosis. Gallbladder is unremarkable. No evidence of biliary ductal dilatation. Spleen: Within normal limits in size and appearance. Adrenals/Urinary Tract: No masses identified. No evidence of hydronephrosis. Unremarkable unopacified urinary bladder. Stomach/Bowel: No evidence of obstruction, inflammatory process or abnormal fluid collections. Normal appendix visualized. Vascular/Lymphatic: No pathologically enlarged lymph nodes. No abdominal aortic aneurysm. Reproductive:  No mass or other significant abnormality. Other:  None. Musculoskeletal: No suspicious bone lesions identified. Incidental note is made of bilateral L5 pars  defects, with degenerative disc disease in mild grade 1 anterolisthesis at L5-S1. IMPRESSION: No acute findings. Hepatic steatosis. Bilateral L5 pars defects, with degenerative disc disease and grade 1 anterolisthesis at L5-S1. Electronically Signed   By: Marlaine Hind M.D.   On: 11/17/2019 21:30    Procedures Procedures (including critical care time)  Medications Ordered in ED Medications  iohexol (OMNIPAQUE) 300 MG/ML solution 100 mL (100 mLs Intravenous Contrast Given 11/17/19 2117)    ED Course  I have reviewed the triage vital signs and the nursing notes.  Pertinent labs & imaging results that were available during my care of the patient were reviewed by me and considered in my medical decision making (see chart for details).    MDM Rules/Calculators/A&P                      Patient presenting with abdominal pain, worse in the right lower quadrant. Leukocytosis mildly at 12.9.  UA is negative.  CT abdomen pelvis is negative for appendicitis finds no acute findings.  On my review of the CT, there does appear to be quite a bit of stool in the area of most tenderness.  Patient does state he has fairly  regular bowel movements, but suspect element of constipation may be causing the pain.  We will try MiraLAX daily until more regular soft bowel movements.  Will refer to GI if symptoms not improving.  Return precautions discussed.  Patient understands and agrees with plan.  Patient vitals stable throughout ED course and discharged in satisfactory condition.  Final Clinical Impression(s) / ED Diagnoses Final diagnoses:  Right lower quadrant abdominal pain    Rx / DC Orders ED Discharge Orders         Ordered    polyethylene glycol (MIRALAX) 17 g packet  Daily     11/17/19 2215           Frederica Kuster, [place] 11/17/19 2232    Wyvonnia Dusky, MD 11/18/19 [id number]

## 2019-11-26 ENCOUNTER — Other Ambulatory Visit: Payer: Self-pay

## 2019-11-27 ENCOUNTER — Ambulatory Visit: Payer: 59 | Admitting: Medical

## 2019-11-27 ENCOUNTER — Encounter: Payer: Self-pay | Admitting: Medical

## 2019-11-27 ENCOUNTER — Other Ambulatory Visit: Payer: Self-pay

## 2019-11-27 VITALS — BP 130/90 | HR 103 | Temp 97.0°F | Resp 18 | Ht 77.0 in | Wt >= 6400 oz

## 2019-11-27 DIAGNOSIS — R1031 Right lower quadrant pain: Secondary | ICD-10-CM | POA: Diagnosis not present

## 2019-11-27 DIAGNOSIS — D72829 Elevated white blood cell count, unspecified: Secondary | ICD-10-CM

## 2019-11-27 NOTE — Patient Instructions (Signed)
Your recent pain in rt lower quadrant pain has decreased a lot and your ct abdomen was negative. Some improvement with bm after use of miralax.   At this point would repeat cbc to evaluate wbc since it was mild elevated in ED. Counseled/discussed appendicitis varying presentation in event pain pesrists worsens or changes. Be cautious as you move to [place]. You could go ahead and signs release form so we can send records to new future pcp.  Faint ruq pain on exam today. Advise on diet changes to avoid worse accumulation of fat on liver.  Follow up to be determined after lab review.

## 2019-11-27 NOTE — Progress Notes (Signed)
Subjective:    Patient ID: David Zuniga, male    DOB: December 20, 1985, 33 y.o.   MRN: [id number]  HPI  Pt in for follow up. Pt states he has residual minimal discomfort now compared to when he was in the ED.   Pt took miralax after he was in the ED. Pt states since then he had more frequent loose stools. Ct of pt abdomen was negative for acute findings.  Pt ED note reads below.  David Zuniga is a 33 y.o. male  with history of hypertension who presents with a 3-day history of abdominal pain, worse on the right side.  He denies any nausea, vomiting, diarrhea.  He has had decreased appetite and early satiety.  He denies any fever, chest pain, shortness of breath, cough, back pain, urinary symptoms, swelling to scrotum.  He took ibuprofen at home without relief.  Patient presenting with abdominal pain, worse in the right lower quadrant. Leukocytosis mildly at 12.9.  UA is negative.  CT abdomen pelvis is negative for appendicitis finds no acute findings.  On my review of the CT, there does appear to be quite a bit of stool in the area of most tenderness.  Patient does state he has fairly regular bowel movements, but suspect element of constipation may be causing the pain.  We will try MiraLAX daily until more regular soft bowel movements.  Will refer to GI if symptoms not improving.  Return precautions discussed.  Patient understands and agrees with plan.  Patient vitals stable throughout ED course and discharged in satisfactory condition.   Pt had some rt lower quadrant tenderness on exam day of visit. Pt wbc was 12.9 in the ED  Pt bp at home has been running in 140/80-90 range.     Review of Systems  Constitutional: Negative for chills, fatigue and fever.  HENT: Negative for congestion, ear discharge and ear pain.   Respiratory: Negative for cough, chest tightness and wheezing.   Cardiovascular: Negative for chest pain and palpitations.  Gastrointestinal: Positive for abdominal pain. Negative  for blood in stool, constipation, diarrhea, nausea and vomiting.       Faint minimal residual type pain.  Musculoskeletal: Negative for back pain, myalgias and neck pain.  Skin: Negative for rash.  Neurological: Negative for dizziness, speech difficulty, weakness, numbness and headaches.  Hematological: Negative for adenopathy. Does not bruise/bleed easily.  Psychiatric/Behavioral: Negative for behavioral problems and decreased concentration.    Past Medical History:  Diagnosis Date  . Hypertension      Social History   Socioeconomic History  . Marital status: Single    Spouse name: Not on file  . Number of children: Not on file  . Years of education: Not on file  . Highest education level: Not on file  Occupational History  . Not on file  Tobacco Use  . Smoking status: Never Smoker  . Smokeless tobacco: Never Used  Substance and Sexual Activity  . Alcohol use: Yes    Alcohol/week: 0.0 standard drinks    Comment: occ  . Drug use: No  . Sexual activity: Not on file  Other Topics Concern  . Not on file  Social History Narrative  . Not on file   Social Determinants of Health   Financial Resource Strain:   . Difficulty of Paying Living Expenses: Not on file  Food Insecurity:   . Worried About Charity fundraiser in the Last Year: Not on file  . Ran Out of Food in  the Last Year: Not on file  Transportation Needs:   . Lack of Transportation (Medical): Not on file  . Lack of Transportation (Non-Medical): Not on file  Physical Activity:   . Days of Exercise per Week: Not on file  . Minutes of Exercise per Session: Not on file  Stress:   . Feeling of Stress : Not on file  Social Connections:   . Frequency of Communication with Friends and Family: Not on file  . Frequency of Social Gatherings with Friends and Family: Not on file  . Attends Religious Services: Not on file  . Active Member of Clubs or Organizations: Not on file  . Attends Archivist Meetings:  Not on file  . Marital Status: Not on file  Intimate Partner Violence:   . Fear of Current or Ex-Partner: Not on file  . Emotionally Abused: Not on file  . Physically Abused: Not on file  . Sexually Abused: Not on file    No past surgical history on file.  Family History  Problem Relation Age of Onset  . Hypertension Mother   . Bipolar disorder Mother        manic depressive  . Hypertension Father   . Cancer Maternal Grandfather 90       lung cancer  . Alcohol abuse Paternal Grandfather   . Heart disease Paternal Grandfather   . Osteoporosis Maternal Grandmother     No Known Allergies  Current Outpatient Medications on File Prior to Visit  Medication Sig Dispense Refill  . cholecalciferol (VITAMIN D) 1000 units tablet Take 1,000 Units by mouth daily.    . fluticasone (FLONASE) 50 MCG/ACT nasal spray Place 2 sprays into both nostrils daily. 16 g 1  . loratadine (CLARITIN) 10 MG tablet Take 10 mg by mouth daily.    Marland Kitchen olmesartan-hydrochlorothiazide (BENICAR HCT) 40-25 MG tablet TAKE 1 TABLET BY MOUTH EVERY DAY 30 tablet 0  . polyethylene glycol (MIRALAX) 17 g packet Take 17 g by mouth daily. 14 each 0  . permethrin (ELIMITE) 5 % cream APPLY TOPICALLY ONCE AS DIRECTED (Patient not taking: Reported on 11/27/2019) 60 g 1  . triamcinolone cream (KENALOG) 0.1 % APPLY TO AFFECTED AREA TWICE A DAY (Patient not taking: Reported on 11/27/2019) 30 g 0   No current facility-administered medications on file prior to visit.    BP 130/90 (BP Location: Left Arm, Patient Position: Sitting, Cuff Size: Large)   Pulse (!) 103   Temp (!) 97 F (36.1 C) (Temporal)   Resp 18   Ht 6\' 5"  (1.956 m)   Wt (!) 430 lb 9.6 oz (195.3 kg)   SpO2 97%   BMI 51.06 kg/m       Objective:   Physical Exam  General Mental Status- Alert. General Appearance- Not in acute distress.   Skin General: Color- Normal Color. Moisture- Normal Moisture.  Neck Carotid Arteries- Normal color. Moisture- Normal  Moisture. No carotid bruits. No JVD.  Chest and Lung Exam Auscultation: Breath Sounds:-Normal.  Cardiovascular Auscultation:Rythm- Regular. Murmurs & Other Heart Sounds:Auscultation of the heart reveals- No Murmurs.  Abdomen Inspection:-Inspeection Normal. Palpation/Percussion:Note:No mass. Palpation and Percussion of the abdomen reveal- Non Tender, Non Distended + BS, no rebound or guarding. No rlq tenderness. No heel jar pain. No pain on movement rt lower ext. Faint ruq tenderness.   Neurologic Cranial Nerve exam:- CN III-XII intact(No nystagmus), symmetric smile. Strength:- 5/5 equal and symmetric strength both upper and lower extremities.      Assessment &  Plan:  Your recent pain in rt lower quadrant pain has decreased a lot and your ct abdomen was negative. Some improvement with bm after use of miralax.   At this point would repeat cbc to evaluate wbc since it was mild elevated in ED. Counseled/discussed appendicitis varying presentation in event pain pesrists worsens or changes. Be cautious as you move to [place]. You could go ahead and signs release form so we can send records to new future pcp.  Faint ruq pain on exam today. Advise on diet changes to avoid worse accumulation of fat on liver.  Follow up to be determined after lab review.  25 minutes spent with pt. 50% of time spent counseling pt on plan going forward.  Mackie Pai, PA-C

## 2019-11-28 LAB — CBC WITH DIFFERENTIAL/PLATELET
Basophils Absolute: 0.1 10*3/uL (ref 0.0–0.1)
Basophils Relative: 0.9 % (ref 0.0–3.0)
Eosinophils Absolute: 0.3 10*3/uL (ref 0.0–0.7)
Eosinophils Relative: 2.3 % (ref 0.0–5.0)
HCT: 42.4 % (ref 39.0–52.0)
Hemoglobin: 14.2 g/dL (ref 13.0–17.0)
Lymphocytes Relative: 32 % (ref 12.0–46.0)
Lymphs Abs: 3.7 10*3/uL (ref 0.7–4.0)
MCHC: 33.4 g/dL (ref 30.0–36.0)
MCV: 88.3 fl (ref 78.0–100.0)
Monocytes Absolute: 0.9 10*3/uL (ref 0.1–1.0)
Monocytes Relative: 8 % (ref 3.0–12.0)
Neutro Abs: 6.5 10*3/uL (ref 1.4–7.7)
Neutrophils Relative %: 56.8 % (ref 43.0–77.0)
Platelets: 383 10*3/uL (ref 150.0–400.0)
RBC: 4.8 Mil/uL (ref 4.22–5.81)
RDW: 13.4 % (ref 11.5–15.5)
WBC: 11.5 10*3/uL — ABNORMAL HIGH (ref 4.0–10.5)

## 2019-12-03 ENCOUNTER — Other Ambulatory Visit: Payer: Self-pay | Admitting: Medical

## 2020-01-05 ENCOUNTER — Other Ambulatory Visit: Payer: Self-pay | Admitting: Medical

## 2020-05-16 ENCOUNTER — Other Ambulatory Visit: Payer: Self-pay | Admitting: Family Medicine

## 2020-10-06 ENCOUNTER — Other Ambulatory Visit: Payer: Self-pay | Admitting: Medical

## 2021-01-05 IMAGING — CT CT ABD-PELV W/ CM
2 of 4 series · 16 of 46 positions shown, 18 images · IV contrast (omnipaque)
Comparison: None.

CLINICAL DATA: Diffuse abdominal pain for 3 days.

EXAM:
CT ABDOMEN AND PELVIS WITH CONTRAST
TECHNIQUE: Multidetector CT imaging of the abdomen and pelvis was performed
using the standard protocol following bolus administration of
intravenous contrast.
CONTRAST:  100mL OMNIPAQUE IOHEXOL 300 MG/ML  SOLN

[Series 2: axial st · axial · 0.85mm/px · z∈[+810,+1300]mm · 13 of 108 slices shown, 15 images]
[im 5/108  soft-tissue]
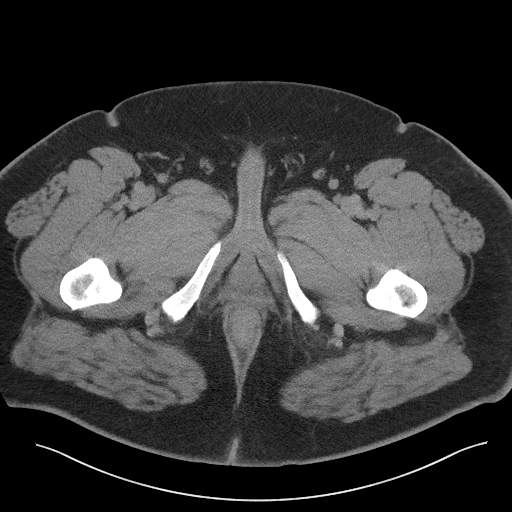
[im 5/108  bone]
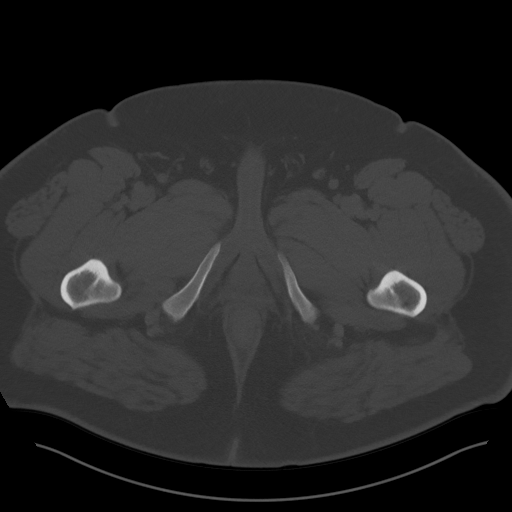
[im 13/108  soft-tissue]
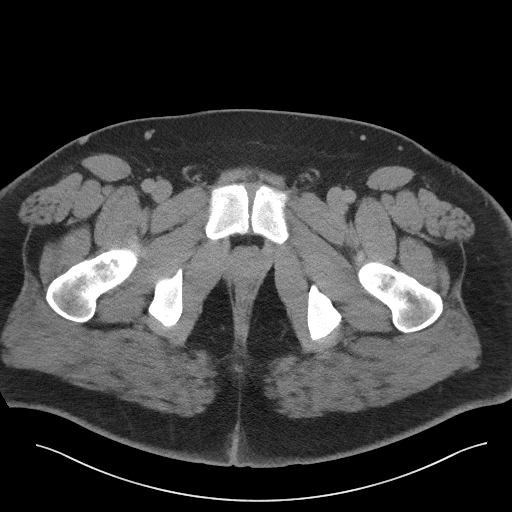
[im 21/108  soft-tissue]
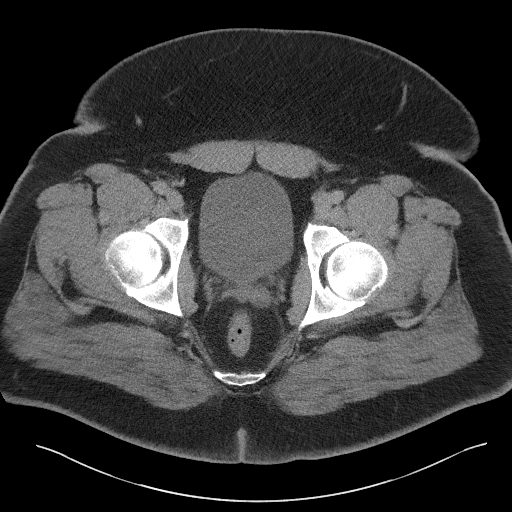
[im 29/108  soft-tissue]
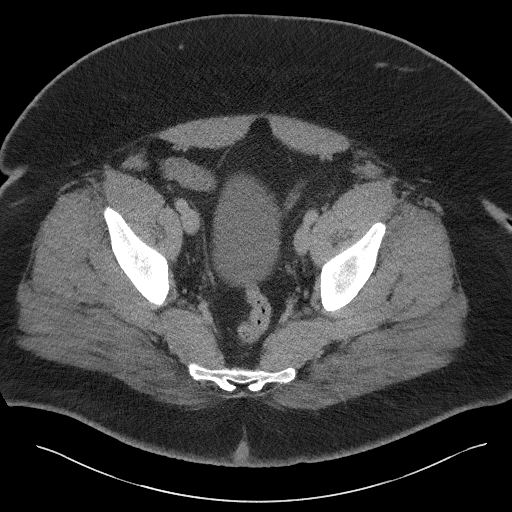
[im 38/108  soft-tissue]
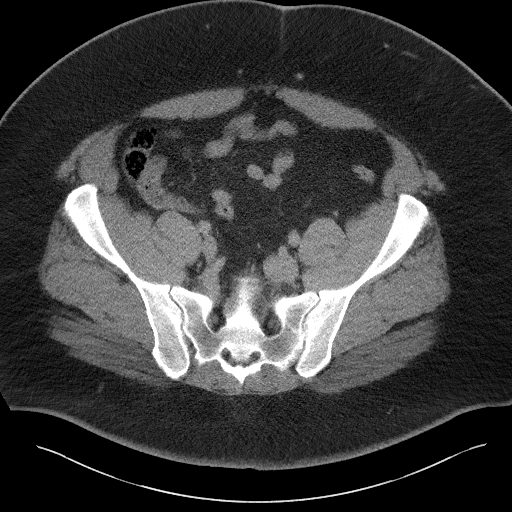
[im 46/108  soft-tissue]
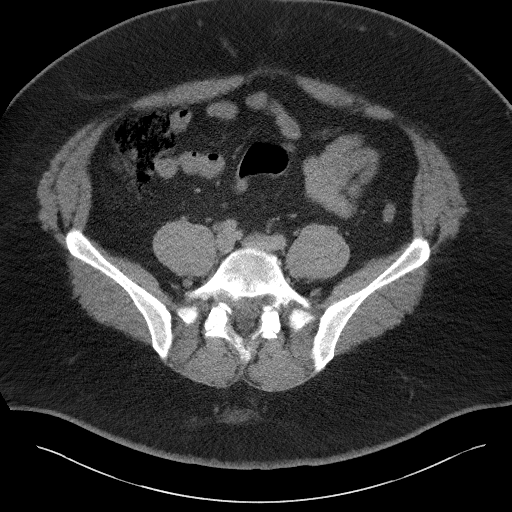
[im 54/108  soft-tissue]
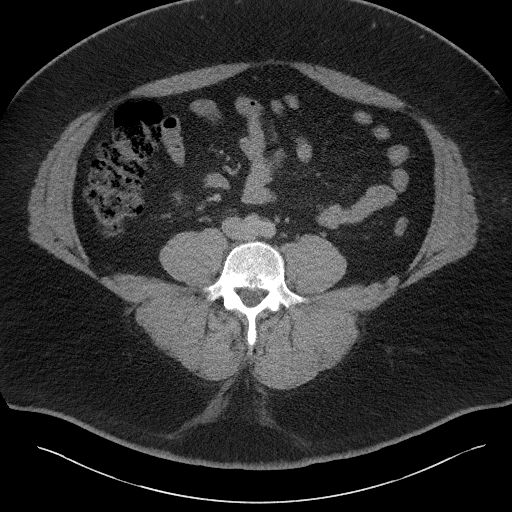
[im 62/108  soft-tissue]
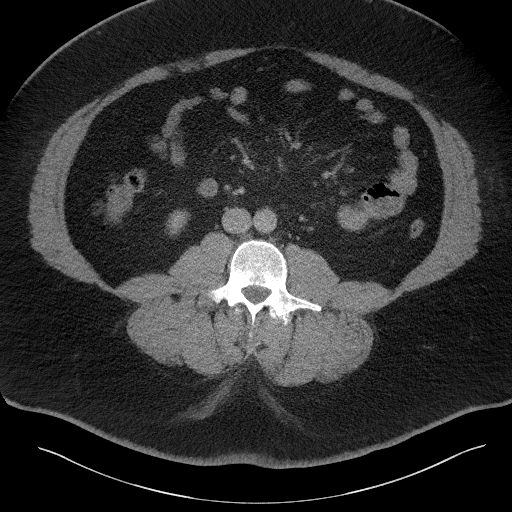
[im 70/108  soft-tissue]
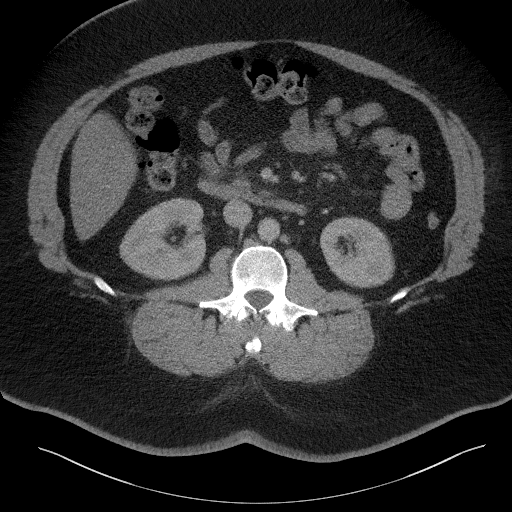
[im 70/108  bone]
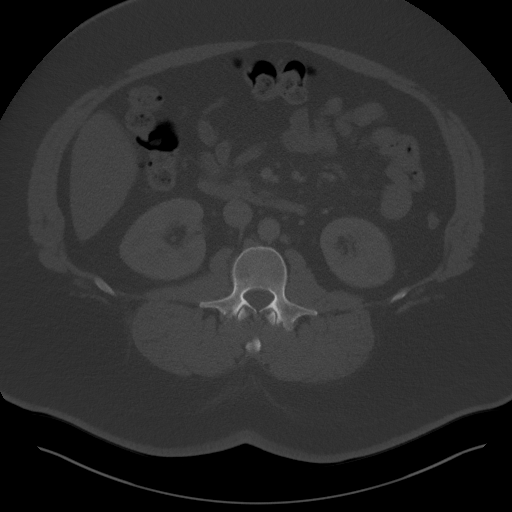
[im 79/108  soft-tissue]
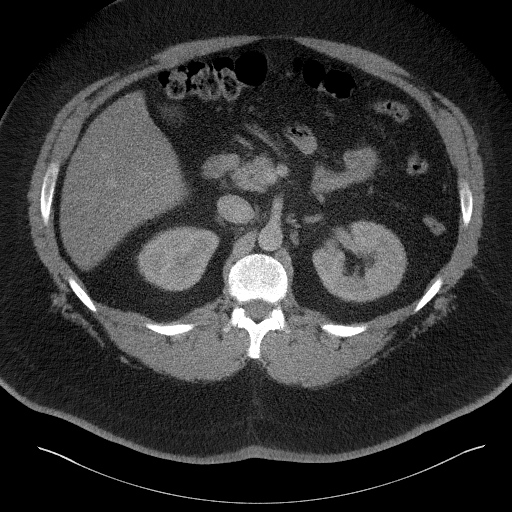
[im 87/108  soft-tissue]
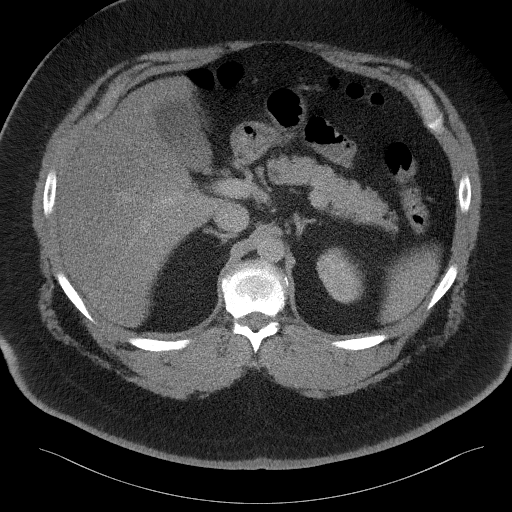
[im 95/108  soft-tissue]
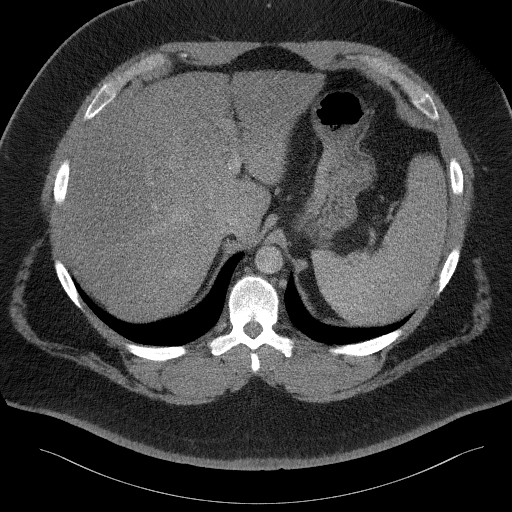
[im 103/108  soft-tissue]
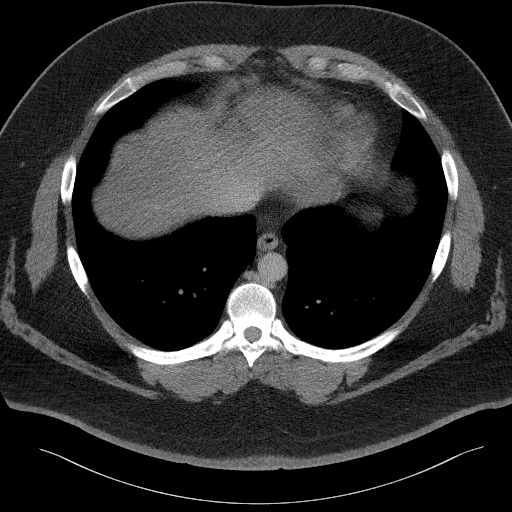

[Series 5: coronal st · coronal · 0.91mm/px · 3 of 127 slices shown]
[im 43/127  soft-tissue]
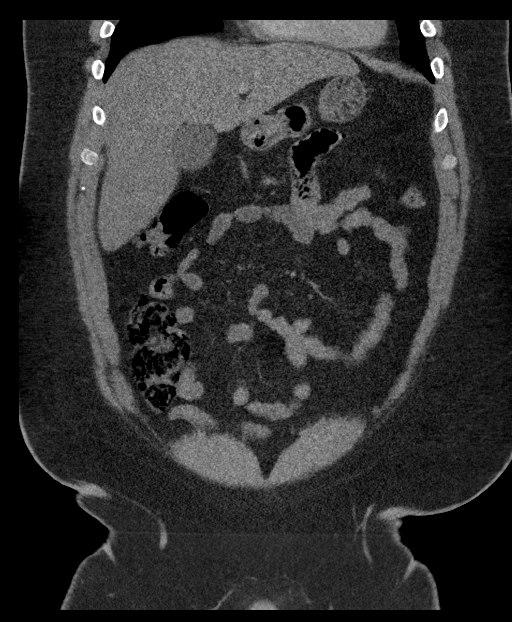
[im 57/127  soft-tissue]
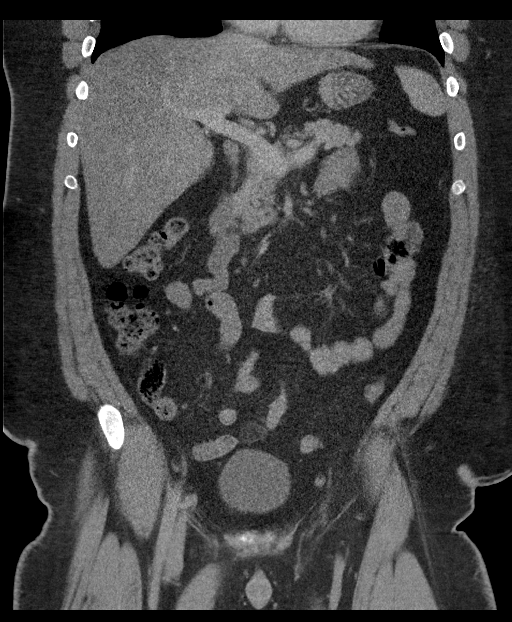
[im 71/127  soft-tissue]
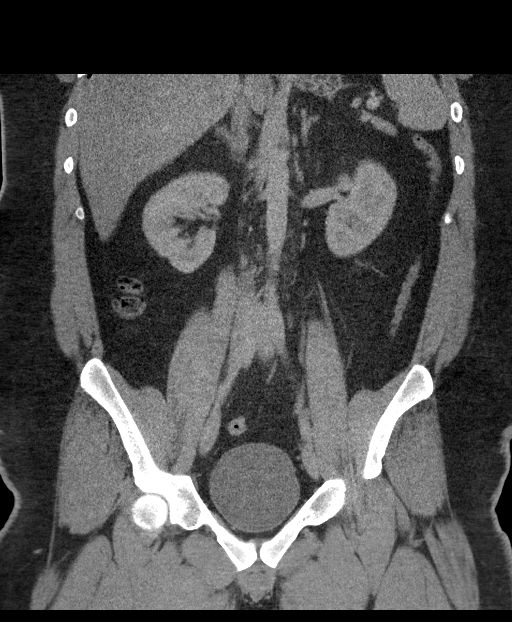

[16 of 46 positions shown; findings below may reference images not displayed]

FINDINGS: Lower Chest: No acute findings.

Hepatobiliary:  No hepatic masses identified.

Pancreas: No mass or inflammatory changes. Mild-to-moderate diffuse
hepatic steatosis. Gallbladder is unremarkable. No evidence of
biliary ductal dilatation.

Spleen: Within normal limits in size and appearance.

Adrenals/Urinary Tract: No masses identified. No evidence of
hydronephrosis. Unremarkable unopacified urinary bladder.

Stomach/Bowel: No evidence of obstruction, inflammatory process or
abnormal fluid collections. Normal appendix visualized.

Vascular/Lymphatic: No pathologically enlarged lymph nodes. No
abdominal aortic aneurysm.

Reproductive:  No mass or other significant abnormality.

Other:  None.

Musculoskeletal: No suspicious bone lesions identified. Incidental
note is made of bilateral L5 pars defects, with degenerative disc
disease in mild grade 1 anterolisthesis at L5-S1.
IMPRESSION: No acute findings.

Hepatic steatosis.

Bilateral L5 pars defects, with degenerative disc disease and grade
1 anterolisthesis at L5-S1.

## 2021-02-12 ENCOUNTER — Other Ambulatory Visit: Payer: Self-pay | Admitting: Medical
